# Patient Record
Sex: Female | Born: 1940 | Race: White | Hispanic: No | Marital: Married | Smoking: Never smoker
Health system: Southern US, Community
[De-identification: ages and names within clinical notes are randomized; demographics above are authoritative.]

## PROBLEM LIST (undated history)

## (undated) DIAGNOSIS — I10 Essential (primary) hypertension: Secondary | ICD-10-CM

## (undated) HISTORY — DX: Essential (primary) hypertension: I10

---

## 1973-08-30 HISTORY — PX: LOBECTOMY: SHX5089

## 2012-09-01 ENCOUNTER — Encounter: Payer: Self-pay | Admitting: Family Medicine

## 2012-09-01 ENCOUNTER — Ambulatory Visit (INDEPENDENT_AMBULATORY_CARE_PROVIDER_SITE_OTHER): Payer: Medicare PPO | Admitting: Family Medicine

## 2012-09-01 VITALS — BP 150/85 | HR 66 | Ht 61.5 in | Wt 109.8 lb

## 2012-09-01 DIAGNOSIS — R030 Elevated blood-pressure reading, without diagnosis of hypertension: Secondary | ICD-10-CM

## 2012-09-01 NOTE — Assessment & Plan Note (Signed)
Initial reading 178/88, repeat 150/85.  Patient does have BP cuff at home, is agreeable to monitoring BP, will have her husband bring the cuff to make sure it is accurate and do BP log for a few weeks.  She understands to call to make an appointment if it is consistently high.

## 2012-09-01 NOTE — Progress Notes (Signed)
  Subjective:    Patient ID: Erika Garner, female    DOB: 05-Nov-1940, 72 y.o.   MRN: [id number]  HPI  Mrs. Kandler comes in to establish care.  She is generally healthy and has no complaints today.    She says she has had high blood pressure readings in the past and they had her wear a blood pressure monitor and determined it was stress and work related and she only took a medication for a short while then they stopped it.    Health Maintenance:  Patient says she does not get the flu shot because it gave her a horrible case of the flu Up to date on Pnumococcal, Tdap, Zostavax Up to date on Mammogram and Colonoscopy Has not had a pap in many years- not sure if she had one at 11  Past Medical History  Diagnosis Date  . Hypertension    Family History  Problem Relation Age of Onset  . Hypertension Mother   . Stroke Mother   . COPD Father    Past Surgical History  Procedure Date  . Lobectomy 1975    they thought she had TB, but did not   History  Substance Use Topics  . Smoking status: Never Smoker   . Smokeless tobacco: Never Used  . Alcohol Use: 4.2 oz/week    7 Glasses of wine per week  (has about one glass of wine with dinner/day)  Review of Systems + for decreased vision (seeing optometry) +muscle/joint aches Otherwise a 12 point ROS is negative.     Objective:   Physical Exam BP 150/85  Pulse 66  Ht 5' 1.5" (1.562 m)  Wt 109 lb 12.8 oz (49.805 kg)  BMI 20.41 kg/m2 General appearance: alert, cooperative and no distress Neck: no adenopathy, supple, symmetrical, trachea midline and thyroid not enlarged, symmetric, no tenderness/mass/nodules Lungs: clear to auscultation bilaterally Heart: regular rate and rhythm, S1, S2 normal, no murmur, click, rub or gallop Abdomen: soft, non-tender; bowel sounds normal; no masses,  no organomegaly Extremities: extremities normal, atraumatic, no cyanosis or edema Pulses: 2+ and symmetric       Assessment & Plan:

## 2012-09-01 NOTE — Patient Instructions (Signed)
It was nice to meet you.  Your blood pressure today was 150/85- a little high.  Please start checking your blood pressure 1-2 times per day.  If it is running above 140/90 on a regular basis over the next few weeks, please call the office for a visit.   Please plan on a Well Woman Exam (physical) in the next few months.  If you remember, try to get a morning appointment and do not eat before the appointment so we can check your blood work.

## 2015-05-09 DIAGNOSIS — Z961 Presence of intraocular lens: Secondary | ICD-10-CM | POA: Diagnosis not present

## 2015-05-14 DIAGNOSIS — R079 Chest pain, unspecified: Secondary | ICD-10-CM | POA: Diagnosis not present

## 2015-05-19 DIAGNOSIS — L821 Other seborrheic keratosis: Secondary | ICD-10-CM | POA: Diagnosis not present

## 2015-05-19 DIAGNOSIS — D2261 Melanocytic nevi of right upper limb, including shoulder: Secondary | ICD-10-CM | POA: Diagnosis not present

## 2015-05-19 DIAGNOSIS — L570 Actinic keratosis: Secondary | ICD-10-CM | POA: Diagnosis not present

## 2015-05-19 DIAGNOSIS — D2271 Melanocytic nevi of right lower limb, including hip: Secondary | ICD-10-CM | POA: Diagnosis not present

## 2015-05-19 DIAGNOSIS — L814 Other melanin hyperpigmentation: Secondary | ICD-10-CM | POA: Diagnosis not present

## 2015-10-06 DIAGNOSIS — M7502 Adhesive capsulitis of left shoulder: Secondary | ICD-10-CM | POA: Diagnosis not present

## 2015-10-06 DIAGNOSIS — M25512 Pain in left shoulder: Secondary | ICD-10-CM | POA: Diagnosis not present

## 2015-10-16 DIAGNOSIS — L821 Other seborrheic keratosis: Secondary | ICD-10-CM | POA: Diagnosis not present

## 2015-10-16 DIAGNOSIS — D485 Neoplasm of uncertain behavior of skin: Secondary | ICD-10-CM | POA: Diagnosis not present

## 2015-10-16 DIAGNOSIS — L820 Inflamed seborrheic keratosis: Secondary | ICD-10-CM | POA: Diagnosis not present

## 2015-10-16 DIAGNOSIS — B079 Viral wart, unspecified: Secondary | ICD-10-CM | POA: Diagnosis not present

## 2015-11-03 DIAGNOSIS — M25512 Pain in left shoulder: Secondary | ICD-10-CM | POA: Diagnosis not present

## 2015-11-03 DIAGNOSIS — M7502 Adhesive capsulitis of left shoulder: Secondary | ICD-10-CM | POA: Diagnosis not present

## 2015-12-19 DIAGNOSIS — K1370 Unspecified lesions of oral mucosa: Secondary | ICD-10-CM | POA: Diagnosis not present

## 2016-03-05 DIAGNOSIS — R040 Epistaxis: Secondary | ICD-10-CM | POA: Diagnosis not present

## 2016-03-09 DIAGNOSIS — R040 Epistaxis: Secondary | ICD-10-CM | POA: Diagnosis not present

## 2016-03-09 DIAGNOSIS — J3489 Other specified disorders of nose and nasal sinuses: Secondary | ICD-10-CM | POA: Diagnosis not present

## 2016-04-02 DIAGNOSIS — Z0000 Encounter for general adult medical examination without abnormal findings: Secondary | ICD-10-CM | POA: Diagnosis not present

## 2016-04-02 DIAGNOSIS — R110 Nausea: Secondary | ICD-10-CM | POA: Diagnosis not present

## 2016-04-02 DIAGNOSIS — L821 Other seborrheic keratosis: Secondary | ICD-10-CM | POA: Diagnosis not present

## 2016-04-02 DIAGNOSIS — K589 Irritable bowel syndrome without diarrhea: Secondary | ICD-10-CM | POA: Diagnosis not present

## 2016-04-02 DIAGNOSIS — M810 Age-related osteoporosis without current pathological fracture: Secondary | ICD-10-CM | POA: Diagnosis not present

## 2016-04-02 DIAGNOSIS — W57XXXA Bitten or stung by nonvenomous insect and other nonvenomous arthropods, initial encounter: Secondary | ICD-10-CM | POA: Diagnosis not present

## 2016-04-06 DIAGNOSIS — J3489 Other specified disorders of nose and nasal sinuses: Secondary | ICD-10-CM | POA: Diagnosis not present

## 2016-04-06 DIAGNOSIS — R430 Anosmia: Secondary | ICD-10-CM | POA: Diagnosis not present

## 2016-04-07 DIAGNOSIS — Z961 Presence of intraocular lens: Secondary | ICD-10-CM | POA: Diagnosis not present

## 2016-04-07 DIAGNOSIS — G453 Amaurosis fugax: Secondary | ICD-10-CM | POA: Diagnosis not present

## 2016-04-20 DIAGNOSIS — Z961 Presence of intraocular lens: Secondary | ICD-10-CM | POA: Diagnosis not present

## 2016-04-20 DIAGNOSIS — H35371 Puckering of macula, right eye: Secondary | ICD-10-CM | POA: Diagnosis not present

## 2016-04-20 DIAGNOSIS — H04123 Dry eye syndrome of bilateral lacrimal glands: Secondary | ICD-10-CM | POA: Diagnosis not present

## 2016-04-20 DIAGNOSIS — G453 Amaurosis fugax: Secondary | ICD-10-CM | POA: Diagnosis not present

## 2016-05-12 DIAGNOSIS — H04123 Dry eye syndrome of bilateral lacrimal glands: Secondary | ICD-10-CM | POA: Diagnosis not present

## 2016-05-12 DIAGNOSIS — Z961 Presence of intraocular lens: Secondary | ICD-10-CM | POA: Diagnosis not present

## 2016-10-24 ENCOUNTER — Emergency Department (HOSPITAL_COMMUNITY): Payer: Medicare PPO

## 2016-10-24 ENCOUNTER — Observation Stay (HOSPITAL_COMMUNITY)
Admission: EM | Admit: 2016-10-24 | Discharge: 2016-10-26 | Disposition: A | Discharge status: Home / Self Care | Payer: Medicare PPO | Attending: Internal Medicine | Admitting: Internal Medicine

## 2016-10-24 ENCOUNTER — Encounter (HOSPITAL_COMMUNITY): Payer: Self-pay | Admitting: Emergency Medicine

## 2016-10-24 DIAGNOSIS — R296 Repeated falls: Secondary | ICD-10-CM | POA: Insufficient documentation

## 2016-10-24 DIAGNOSIS — R05 Cough: Secondary | ICD-10-CM

## 2016-10-24 DIAGNOSIS — R2681 Unsteadiness on feet: Secondary | ICD-10-CM | POA: Diagnosis not present

## 2016-10-24 DIAGNOSIS — R112 Nausea with vomiting, unspecified: Secondary | ICD-10-CM

## 2016-10-24 DIAGNOSIS — J101 Influenza due to other identified influenza virus with other respiratory manifestations: Secondary | ICD-10-CM | POA: Diagnosis present

## 2016-10-24 DIAGNOSIS — J9600 Acute respiratory failure, unspecified whether with hypoxia or hypercapnia: Secondary | ICD-10-CM | POA: Insufficient documentation

## 2016-10-24 DIAGNOSIS — E871 Hypo-osmolality and hyponatremia: Secondary | ICD-10-CM | POA: Insufficient documentation

## 2016-10-24 DIAGNOSIS — E878 Other disorders of electrolyte and fluid balance, not elsewhere classified: Secondary | ICD-10-CM | POA: Diagnosis not present

## 2016-10-24 DIAGNOSIS — J111 Influenza due to unidentified influenza virus with other respiratory manifestations: Principal | ICD-10-CM | POA: Insufficient documentation

## 2016-10-24 DIAGNOSIS — R059 Cough, unspecified: Secondary | ICD-10-CM

## 2016-10-24 DIAGNOSIS — E860 Dehydration: Secondary | ICD-10-CM | POA: Diagnosis not present

## 2016-10-24 DIAGNOSIS — I10 Essential (primary) hypertension: Secondary | ICD-10-CM | POA: Diagnosis not present

## 2016-10-24 DIAGNOSIS — R531 Weakness: Secondary | ICD-10-CM

## 2016-10-24 DIAGNOSIS — R69 Illness, unspecified: Secondary | ICD-10-CM

## 2016-10-24 LAB — URINALYSIS, ROUTINE W REFLEX MICROSCOPIC
BACTERIA UA: NONE SEEN
BILIRUBIN URINE: NEGATIVE
GLUCOSE, UA: NEGATIVE mg/dL
HGB URINE DIPSTICK: NEGATIVE
Ketones, ur: 20 mg/dL — AB
LEUKOCYTES UA: NEGATIVE
NITRITE: NEGATIVE
PH: 5 (ref 5.0–8.0)
Protein, ur: 30 mg/dL — AB
Specific Gravity, Urine: 1.018 (ref 1.005–1.030)

## 2016-10-24 LAB — COMPREHENSIVE METABOLIC PANEL
ALT: 17 U/L (ref 14–54)
AST: 29 U/L (ref 15–41)
Albumin: 3.5 g/dL (ref 3.5–5.0)
Alkaline Phosphatase: 40 U/L (ref 38–126)
Anion gap: 12 (ref 5–15)
BUN: 8 mg/dL (ref 6–20)
CO2: 25 mmol/L (ref 22–32)
Calcium: 8.6 mg/dL — ABNORMAL LOW (ref 8.9–10.3)
Chloride: 90 mmol/L — ABNORMAL LOW (ref 101–111)
Creatinine, Ser: 0.66 mg/dL (ref 0.44–1.00)
GFR calc Af Amer: >60 mL/min (ref >60)
GFR calc non Af Amer: >60 mL/min (ref >60)
Glucose, Bld: 148 mg/dL — ABNORMAL HIGH (ref 65–99)
Potassium: 3.6 mmol/L (ref 3.5–5.1)
Sodium: 127 mmol/L — ABNORMAL LOW (ref 135–145)
Total Bilirubin: 0.5 mg/dL (ref 0.3–1.2)
Total Protein: 6.8 g/dL (ref 6.5–8.1)

## 2016-10-24 LAB — CBC WITH DIFFERENTIAL/PLATELET
Basophils Absolute: 0 10*3/uL (ref 0.0–0.1)
Basophils Relative: 0 %
Eosinophils Absolute: 0 10*3/uL (ref 0.0–0.7)
Eosinophils Relative: 0 %
HCT: 39.5 % (ref 36.0–46.0)
Hemoglobin: 13.5 g/dL (ref 12.0–15.0)
Lymphocytes Relative: 7 %
Lymphs Abs: 0.5 10*3/uL — ABNORMAL LOW (ref 0.7–4.0)
MCH: 31.1 pg (ref 26.0–34.0)
MCHC: 34.2 g/dL (ref 30.0–36.0)
MCV: 91 fL (ref 78.0–100.0)
Monocytes Absolute: 0.7 10*3/uL (ref 0.1–1.0)
Monocytes Relative: 10 %
Neutro Abs: 5.4 10*3/uL (ref 1.7–7.7)
Neutrophils Relative %: 83 %
Platelets: 132 10*3/uL — ABNORMAL LOW (ref 150–400)
RBC: 4.34 MIL/uL (ref 3.87–5.11)
RDW: 12.5 % (ref 11.5–15.5)
WBC: 6.5 10*3/uL (ref 4.0–10.5)

## 2016-10-24 LAB — I-STAT CG4 LACTIC ACID, ED: LACTIC ACID, VENOUS: 1.09 mmol/L (ref 0.5–1.9)

## 2016-10-24 LAB — LIPASE, BLOOD: Lipase: 19 U/L (ref 11–51)

## 2016-10-24 MED ORDER — ALBUTEROL SULFATE (2.5 MG/3ML) 0.083% IN NEBU
5.0000 mg | INHALATION_SOLUTION | Freq: Once | RESPIRATORY_TRACT | Status: AC
Start: 2016-10-24 — End: 2016-10-24
  Administered 2016-10-24: 5 mg via RESPIRATORY_TRACT
  Filled 2016-10-24: qty 6

## 2016-10-24 MED ORDER — SODIUM CHLORIDE 0.9 % IV BOLUS (SEPSIS)
1000.0000 mL | Freq: Once | INTRAVENOUS | Status: AC
  Administered 2016-10-24: 1000 mL via INTRAVENOUS

## 2016-10-24 NOTE — ED Provider Notes (Signed)
[facility]-EMERGENCY DEPT Provider Note   CSN: [id number] Arrival date & time: 10/24/16  2200     History   Chief Complaint Chief Complaint  Patient presents with  . Cough  . Fever  . Emesis    HPI Erika Garner is a 76 y.o. female with a hx of Hypertension (diet controlled) presents to the Emergency Department complaining of gradual, persistent, progressively worsening URI and influenza-like illness onset 3 weeks ago. Patient reports that her initial symptoms began with cough, nasal congestion, postnasal drip, sore throat. She reports that one week ago she developed nausea vomiting and diarrhea and addition to her other symptoms. She reports fevers at home in the last several weeks to 100.6. She has taken Tylenol for this with initial improvement. She reports that she's had 6 episodes of nonbloody nonbilious emesis each day for the last 3 days and about that many episodes of watery diarrhea. Denies melena or hematochezia. Patient reports she was seen this morning at [facility]. She tested positive for influenza during this visit. She reports she was given an unknown intramuscular injection and a prescription. She reports the medications made her feel woozy and she has had 6 separate falls tonight due to this. Patient's husband demanded that she be evaluated which is the reason for her visit.    The history is provided by the patient and medical records. No language interpreter was used.    Past Medical History:  Diagnosis Date  . Hypertension     Patient Active Problem List   Diagnosis Date Noted  . Elevated blood pressure reading without diagnosis of hypertension 09/01/2012    Past Surgical History:  Procedure Laterality Date  . LOBECTOMY  1975   they thought she had TB, but did not    OB History    No data available       Home Medications    Prior to Admission medications   Medication Sig Start Date End Date Taking? Authorizing Provider    cholecalciferol (VITAMIN D) 400 UNITS TABS Take by mouth.    Historical Provider, MD    Family History Family History  Problem Relation Age of Onset  . Hypertension Mother   . Stroke Mother   . COPD Father     Social History Social History  Substance Use Topics  . Smoking status: Never Smoker  . Smokeless tobacco: Never Used  . Alcohol use 4.2 oz/week    7 Glasses of wine per week     Allergies   Patient has no known allergies.   Review of Systems Review of Systems  Constitutional: Positive for fever.  HENT: Positive for congestion, postnasal drip, rhinorrhea, sinus pressure and sore throat.   Respiratory: Positive for cough.   Gastrointestinal: Positive for vomiting.  All other systems reviewed and are negative.    Physical Exam Updated Vital Signs BP 150/90   Pulse 96   Temp 99.3 F (37.4 C) (Oral)   Resp 16   Ht 5\' 2"  (1.575 m)   Wt 49.9 kg   SpO2 95%   BMI 20.12 kg/m   Physical Exam  Constitutional: She appears well-developed and well-nourished. No distress.  Awake, alert, nontoxic appearance  HENT:  Head: Normocephalic and atraumatic.  Right Ear: Tympanic membrane, external ear and ear canal normal.  Left Ear: Tympanic membrane, external ear and ear canal normal.  Nose: Mucosal edema and rhinorrhea present. No epistaxis. Right sinus exhibits no maxillary sinus tenderness and no frontal sinus tenderness. Left  sinus exhibits no maxillary sinus tenderness and no frontal sinus tenderness.  Mouth/Throat: Uvula is midline, oropharynx is clear and moist and mucous membranes are normal. Mucous membranes are not pale and not cyanotic. No oropharyngeal exudate, posterior oropharyngeal edema, posterior oropharyngeal erythema or tonsillar abscesses.  Eyes: Conjunctivae are normal. Pupils are equal, round, and reactive to light. No scleral icterus.  Neck: Normal range of motion and full passive range of motion without pain. Neck supple.  Persistent clearing of  throat  Cardiovascular: Normal rate, regular rhythm and intact distal pulses.   Pulmonary/Chest: Effort normal. No stridor. No respiratory distress. She has decreased breath sounds. She has wheezes. She has rhonchi.  Rhonchi and wheezes throughout Congested cough  Abdominal: Soft. Bowel sounds are normal. She exhibits no distension and no mass. There is no tenderness. There is no rebound and no guarding.  Musculoskeletal: Normal range of motion. She exhibits no edema.  Lymphadenopathy:    She has no cervical adenopathy.  Neurological: She is alert.  Speech is clear and goal oriented Moves extremities without ataxia  Skin: Skin is warm and dry. No rash noted. She is not diaphoretic.  Psychiatric: She has a normal mood and affect.  Nursing note and vitals reviewed.    ED Treatments / Results  Labs (all labs ordered are listed, but only abnormal results are displayed) Labs Reviewed  CBC WITH DIFFERENTIAL/PLATELET - Abnormal; Notable for the following:       Result Value   Platelets 132 (*)    Lymphs Abs 0.5 (*)    All other components within normal limits  COMPREHENSIVE METABOLIC PANEL - Abnormal; Notable for the following:    Sodium 127 (*)    Chloride 90 (*)    Glucose, Bld 148 (*)    Calcium 8.6 (*)    All other components within normal limits  URINALYSIS, ROUTINE W REFLEX MICROSCOPIC - Abnormal; Notable for the following:    Ketones, ur 20 (*)    Protein, ur 30 (*)    Squamous Epithelial / LPF 0-5 (*)    All other components within normal limits  LIPASE, BLOOD  INFLUENZA PANEL BY PCR (TYPE A & B)  I-STAT CG4 LACTIC ACID, ED    Radiology Dg Chest 2 View  Result Date: 10/24/2016 CLINICAL DATA:  Cough and fever. EXAM: CHEST  2 VIEW COMPARISON:  None. FINDINGS: Lungs are adequately inflated with mild linear scarring/ atelectasis over the left base. Minimal prominence of the perihilar markings which may be due to mild vascular congestion. Cardiomediastinal silhouette is  within normal. There are mild degenerative changes of the spine IMPRESSION: Possible mild vascular congestion. Minimal left base linear scarring/ atelectasis. Electronically Signed   By: Elberta Fortis M.D.   On: 10/24/2016 23:12    Procedures Procedures (including critical care time)  Medications Ordered in ED Medications  ondansetron (ZOFRAN) injection 4 mg (not administered)  ondansetron (ZOFRAN) injection 4 mg (not administered)  promethazine (PHENERGAN) injection 12.5 mg (not administered)  0.9 %  sodium chloride infusion (not administered)  sodium chloride 0.9 % bolus 1,000 mL (0 mLs Intravenous Stopped 10/24/16 2357)  albuterol (PROVENTIL) (2.5 MG/3ML) 0.083% nebulizer solution 5 mg (5 mg Nebulization Given 10/24/16 2315)  acetaminophen (TYLENOL) tablet 1,000 mg (1,000 mg Oral Given 10/25/16 0048)     Initial Impression / Assessment and Plan / ED Course  I have reviewed the triage vital signs and the nursing notes.  Pertinent labs & imaging results that were available during my care of  the patient were reviewed by me and considered in my medical decision making (see chart for details).  Clinical Course as of Oct 25 49  Mon Oct 25, 2016  0032 Sodium: (!) 127 [HM]  4098 Discussed with Dr. Julian Reil who will admit  [HM]  0050 The patient was discussed with and seen by Dr. Jodi Mourning who agrees with the treatment plan.   [HM]    Clinical Course User Index [HM] Dierdre Forth, PA-C    Patient presents with reportedly diagnosed influenza this morning. She's had 3 days of generalized weakness, persistent vomiting and diarrhea.  Patient has been arrived at bedside and reports that she's been unable to get out of bed for the last 3 days because of her weakness. Today she had a near syncopal episode. She's had almost no oral intake for 3 days.  Patient is febrile here in the emergency department to 102. Hyponatremia noted. Nonfocal neuro exam. Highly doubt posterior circulation  stroke. Patient will need admission for fluids.    Final Clinical Impressions(s) / ED Diagnoses   Final diagnoses:  Non-intractable vomiting with nausea, unspecified vomiting type  Influenza-like illness  Dehydration  Generalized weakness    New Prescriptions New Prescriptions   No medications on file     Dierdre Forth, PA-C 10/25/16 0051    Blane Ohara, MD 10/25/16 [phone]

## 2016-10-24 NOTE — ED Triage Notes (Signed)
Pt presents today for flu like symptoms that have been ongoing for 3 weeks; pt reporting chest congestion, hoarse voice, nasal drainage, subjective fevers, n/v; pt [facility] and given rxs, reports no improvement

## 2016-10-24 NOTE — ED Notes (Signed)
Patient transported to X-ray 

## 2016-10-25 DIAGNOSIS — J111 Influenza due to unidentified influenza virus with other respiratory manifestations: Secondary | ICD-10-CM | POA: Diagnosis not present

## 2016-10-25 DIAGNOSIS — J101 Influenza due to other identified influenza virus with other respiratory manifestations: Secondary | ICD-10-CM | POA: Diagnosis not present

## 2016-10-25 DIAGNOSIS — E871 Hypo-osmolality and hyponatremia: Secondary | ICD-10-CM | POA: Diagnosis not present

## 2016-10-25 DIAGNOSIS — E860 Dehydration: Secondary | ICD-10-CM | POA: Diagnosis present

## 2016-10-25 LAB — BASIC METABOLIC PANEL
ANION GAP: 7 (ref 5–15)
BUN: 8 mg/dL (ref 6–20)
CHLORIDE: 96 mmol/L — AB (ref 101–111)
CO2: 27 mmol/L (ref 22–32)
Calcium: 7.9 mg/dL — ABNORMAL LOW (ref 8.9–10.3)
Creatinine, Ser: 0.6 mg/dL (ref 0.44–1.00)
GFR calc non Af Amer: >60 mL/min (ref >60)
GLUCOSE: 122 mg/dL — AB (ref 65–99)
POTASSIUM: 3.4 mmol/L — AB (ref 3.5–5.1)
Sodium: 130 mmol/L — ABNORMAL LOW (ref 135–145)

## 2016-10-25 LAB — MAGNESIUM: Magnesium: 1.5 mg/dL — ABNORMAL LOW (ref 1.7–2.4)

## 2016-10-25 MED ORDER — ALBUTEROL SULFATE (2.5 MG/3ML) 0.083% IN NEBU: 2.5000 mg | INHALATION_SOLUTION | RESPIRATORY_TRACT | Status: DC | PRN

## 2016-10-25 MED ORDER — POTASSIUM CHLORIDE CRYS ER 20 MEQ PO TBCR
40.0000 meq | EXTENDED_RELEASE_TABLET | Freq: Once | ORAL | Status: AC
  Administered 2016-10-25: 40 meq via ORAL
  Filled 2016-10-25: qty 2

## 2016-10-25 MED ORDER — OSELTAMIVIR PHOSPHATE 75 MG PO CAPS: 75.0000 mg | ORAL_CAPSULE | Freq: Two times a day (BID) | ORAL | Status: DC

## 2016-10-25 MED ORDER — PROMETHAZINE HCL 25 MG/ML IJ SOLN: 12.5000 mg | Freq: Four times a day (QID) | INTRAMUSCULAR | Status: DC | PRN

## 2016-10-25 MED ORDER — SODIUM CHLORIDE 0.9 % IV SOLN
INTRAVENOUS | Status: DC
  Administered 2016-10-25 – 2016-10-26 (×2): via INTRAVENOUS

## 2016-10-25 MED ORDER — ONDANSETRON HCL 4 MG/2ML IJ SOLN
4.0000 mg | Freq: Once | INTRAMUSCULAR | Status: AC
  Administered 2016-10-25: 4 mg via INTRAVENOUS
  Filled 2016-10-25: qty 2

## 2016-10-25 MED ORDER — DM-GUAIFENESIN ER 30-600 MG PO TB12
1.0000 | ORAL_TABLET | Freq: Two times a day (BID) | ORAL | Status: DC
  Administered 2016-10-25 – 2016-10-26 (×4): 1 via ORAL
  Filled 2016-10-25 (×4): qty 1

## 2016-10-25 MED ORDER — HYDROCOD POLST-CPM POLST ER 10-8 MG/5ML PO SUER: 5.0000 mL | Freq: Once | ORAL | Status: DC

## 2016-10-25 MED ORDER — MENTHOL 3 MG MT LOZG
1.0000 | LOZENGE | OROMUCOSAL | Status: DC | PRN
  Administered 2016-10-26: 3 mg via ORAL
  Filled 2016-10-25: qty 9

## 2016-10-25 MED ORDER — ENOXAPARIN SODIUM 40 MG/0.4ML ~~LOC~~ SOLN
40.0000 mg | SUBCUTANEOUS | Status: DC
  Administered 2016-10-25 – 2016-10-26 (×2): 40 mg via SUBCUTANEOUS
  Filled 2016-10-25 (×2): qty 0.4

## 2016-10-25 MED ORDER — ACETAMINOPHEN 500 MG PO TABS
1000.0000 mg | ORAL_TABLET | Freq: Once | ORAL | Status: AC
  Administered 2016-10-25: 1000 mg via ORAL
  Filled 2016-10-25: qty 2

## 2016-10-25 MED ORDER — ACETAMINOPHEN 325 MG PO TABS
650.0000 mg | ORAL_TABLET | Freq: Four times a day (QID) | ORAL | Status: DC | PRN
  Administered 2016-10-25 – 2016-10-26 (×2): 650 mg via ORAL
  Filled 2016-10-25 (×2): qty 2

## 2016-10-25 MED ORDER — OSELTAMIVIR PHOSPHATE 30 MG PO CAPS
30.0000 mg | ORAL_CAPSULE | Freq: Two times a day (BID) | ORAL | Status: DC
  Administered 2016-10-25 – 2016-10-26 (×3): 30 mg via ORAL
  Filled 2016-10-25 (×3): qty 1

## 2016-10-25 MED ORDER — OSELTAMIVIR PHOSPHATE 30 MG PO CAPS: 30.0000 mg | ORAL_CAPSULE | Freq: Two times a day (BID) | ORAL | Status: DC

## 2016-10-25 MED ORDER — ONDANSETRON HCL 4 MG/2ML IJ SOLN: 4.0000 mg | Freq: Four times a day (QID) | INTRAMUSCULAR | Status: DC | PRN

## 2016-10-25 MED ORDER — PHENOL 1.4 % MT LIQD
1.0000 | OROMUCOSAL | Status: DC | PRN
  Administered 2016-10-26: 1 via OROMUCOSAL
  Filled 2016-10-25: qty 177

## 2016-10-25 NOTE — H&P (Addendum)
History and Physical    Erika Daviesatricia R Roca TOI:[id number] DOB: 08/03/1941 DOA: 10/24/2016   [facility] Chief Complaint:  Chief Complaint  Patient presents with  . Cough  . Fever  . Emesis    HPI: Erika Garner is a 76 y.o. female with medical history significant of HTN, diet controlled.  Patient presents to the ED with c/o URI, cough, congestion, N/V.  Initially had URI symptoms 3 weeks ago.  One week ago developed N/V/D, fevers over the past couple of days to 100.6.  Tylenol without relief.  [facility]  Got flu swab there which was positive.  Started on tamiflu and an IM injection.  She felt woozy and had multiple falls this afternoon.  Poor PO intake over last couple of days due to N/V.  ED Course: Hyponatremia and hypochloremia.  Review of Systems: As per HPI otherwise 10 point review of systems negative.    Past Medical History:  Diagnosis Date  . Hypertension     Past Surgical History:  Procedure Laterality Date  . LOBECTOMY  1975   they thought she had TB, but did not     reports that she has never smoked. She has never used smokeless tobacco. She reports that she drinks about 4.2 oz of alcohol per week . She reports that she does not use drugs.  No Known Allergies  Family History  Problem Relation Age of Onset  . Hypertension Mother   . Stroke Mother   . COPD Father       Prior to Admission medications   Medication Sig Start Date End Date Taking? Authorizing Provider  cholecalciferol (VITAMIN D) 400 UNITS TABS Take by mouth.    Historical Provider, MD    Physical Exam: Vitals:   10/25/16 0000 10/25/16 0015 10/25/16 0030 10/25/16 0045  BP: 166/66 141/77 155/75 148/72  Pulse: 93 93  90  Resp:      Temp:      TempSrc:      SpO2: 94% 94%    Weight:      Height:          Constitutional: NAD, calm, comfortable Eyes: PERRL, lids and conjunctivae normal ENMT: Mucous membranes are moist.  Posterior pharynx clear of any exudate or lesions.Normal dentition.  Neck: normal, supple, no masses, no thyromegaly Respiratory: clear to auscultation bilaterally, no wheezing, no crackles. Normal respiratory effort. No accessory muscle use.  Cardiovascular: Regular rate and rhythm, no murmurs / rubs / gallops. No extremity edema. 2+ pedal pulses. No carotid bruits.  Abdomen: no tenderness, no masses palpated. No hepatosplenomegaly. Bowel sounds positive.  Musculoskeletal: no clubbing / cyanosis. No joint deformity upper and lower extremities. Good ROM, no contractures. Normal muscle tone.  Skin: no rashes, lesions, ulcers. No induration Neurologic: CN 2-12 grossly intact. Sensation intact, DTR normal. Strength 5/5 in all 4.  Psychiatric: Normal judgment and insight. Alert and oriented x 3. Normal mood.    Labs on Admission: I have personally reviewed following labs and imaging studies  CBC:  Recent Labs Lab 10/24/16 2247  WBC 6.5  NEUTROABS 5.4  HGB 13.5  HCT 39.5  MCV 91.0  PLT 132*   Basic Metabolic Panel:  Recent Labs Lab 10/24/16 2247  NA 127*  K 3.6  CL 90*  CO2 25  GLUCOSE 148*  BUN 8  CREATININE 0.66  CALCIUM 8.6*   GFR: Estimated Creatinine Clearance: 47.9 mL/min (by C-G formula based on  SCr of 0.66 mg/dL). Liver Function Tests:  Recent Labs Lab 10/24/16 2247  AST 29  ALT 17  ALKPHOS 40  BILITOT 0.5  PROT 6.8  ALBUMIN 3.5    Recent Labs Lab 10/24/16 2247  LIPASE 19   No results for input(s): AMMONIA in the last 168 hours. Coagulation Profile: No results for input(s): INR, PROTIME in the last 168 hours. Cardiac Enzymes: No results for input(s): CKTOTAL, CKMB, CKMBINDEX, TROPONINI in the last 168 hours. BNP (last 3 results) No results for input(s): PROBNP in the last 8760 hours. HbA1C: No results for input(s): HGBA1C in the last 72 hours. CBG: No results for input(s): GLUCAP in the last 168 hours. Lipid Profile: No results for input(s):  CHOL, HDL, LDLCALC, TRIG, CHOLHDL, LDLDIRECT in the last 72 hours. Thyroid Function Tests: No results for input(s): TSH, T4TOTAL, FREET4, T3FREE, THYROIDAB in the last 72 hours. Anemia Panel: No results for input(s): VITAMINB12, FOLATE, FERRITIN, TIBC, IRON, RETICCTPCT in the last 72 hours. Urine analysis:    Component Value Date/Time   COLORURINE YELLOW 10/24/2016 2312   APPEARANCEUR CLEAR 10/24/2016 2312   LABSPEC 1.018 10/24/2016 2312   PHURINE 5.0 10/24/2016 2312   GLUCOSEU NEGATIVE 10/24/2016 2312   HGBUR NEGATIVE 10/24/2016 2312   BILIRUBINUR NEGATIVE 10/24/2016 2312   KETONESUR 20 (A) 10/24/2016 2312   PROTEINUR 30 (A) 10/24/2016 2312   NITRITE NEGATIVE 10/24/2016 2312   LEUKOCYTESUR NEGATIVE 10/24/2016 2312   Sepsis Labs: @LABRCNTIP (procalcitonin:4,lacticidven:4) )No results found for this or any previous visit (from the past 240 hour(s)).   Radiological Exams on Admission: Dg Chest 2 View  Result Date: 10/24/2016 CLINICAL DATA:  Cough and fever. EXAM: CHEST  2 VIEW COMPARISON:  None. FINDINGS: Lungs are adequately inflated with mild linear scarring/ atelectasis over the left base. Minimal prominence of the perihilar markings which may be due to mild vascular congestion. Cardiomediastinal silhouette is within normal. There are mild degenerative changes of the spine IMPRESSION: Possible mild vascular congestion. Minimal left base linear scarring/ atelectasis. Electronically Signed   By: Elberta Fortis M.D.   On: 10/24/2016 23:12    EKG: Independently reviewed.  Assessment/Plan Principal Problem:   Influenza A with respiratory manifestations Active Problems:   Influenza   Dehydration with hyponatremia    1. Influenza - Patient with ILI symptoms and positive flu test at [facility] physicians office today. 1. While I do hear of many false negatives with the office based testing, I haven't really seen any false positives with this test.  Therefore patient almost certainly has  influenza.  Will skip testing. 2. Continue tamiflu 3. Control N/V with zofran, phenergan for breakthrough 4. Tylenol for fever 5. mucinex for chest congestion 2. Dehydration and hyponatremia - due to N/V and poor PO intake 1. IVF, NS at 75 cc/hr 2. Repeat BMP in AM   DVT prophylaxis: Lovenox Code Status: Full Family Communication: No family in room Consults called: None Admission status: Place in 59, Heywood Iles. DO [facility] Pager [phone] from 7PM-7AM  If 7AM-7PM, please contact the day physician for the patient [email or web address] Password [email or web address]  10/25/2016, 1:09 AM

## 2016-10-25 NOTE — Care Management Obs Status (Signed)
MEDICARE OBSERVATION STATUS NOTIFICATION   Patient Details  Name: Erika Garner MRN: [id number] Date of Birth: 01/25/1941   Medicare Observation Status Notification Given:  Yes    Epifanio LeschesCole, Kaylla Cobos Hudson, RN 10/25/2016, 2:14 PM

## 2016-10-25 NOTE — Progress Notes (Signed)
Called for report on patient.  Report given by [employer]. Awaiting patient.

## 2016-10-25 NOTE — Evaluation (Signed)
Physical Therapy Evaluation Patient Details Name: Erika Garner MRN: [id number] DOB: 03-15-1941 Today's Date: 10/25/2016   History of Present Illness   Erika Garner is a 76 y.o. female with medical history significant of HTN, diet controlled.  Patient presents to the ED with c/o URI, cough, congestion, N/V.  Initially had URI symptoms 3 weeks ago.  One week ago developed N/V/D, fevers over the past couple of days to 100.6.  Tylenol without relief.  Went to [place] walk in clinic this morning.  Got flu swab there which was positive.  Started on tamiflu and an IM injection.  She felt woozy and had multiple falls this afternoon.   Clinical Impression  Pt admitted with above diagnosis. Pt currently with functional limitations due to the deficits listed below (see PT Problem List). Pt mildly unsteady with ambulation, expect from 3 days of poor hydration and intake. Will follow acutely but do not anticipate her needing f/u after d/c. Pt will benefit from skilled PT to increase their independence and safety with mobility to allow discharge to the venue listed below.       Follow Up Recommendations No PT follow up    Equipment Recommendations  None recommended by PT    Recommendations for Other Services       Precautions / Restrictions Precautions Precautions: Fall Restrictions Weight Bearing Restrictions: No      Mobility  Bed Mobility Overal bed mobility: Modified Independent                Transfers Overall transfer level: Needs assistance Equipment used: None Transfers: Sit to/from Stand Sit to Stand: Supervision         General transfer comment: no LOB with sit to stand though pt does reach for stable surface quickly after standing up  Ambulation/Gait Ambulation/Gait assistance: Min assist Ambulation Distance (Feet): 250 Feet Assistive device: None Gait Pattern/deviations: Step-through pattern Gait velocity: slightly decreased, guarded Gait velocity  interpretation: Below normal speed for age/gender General Gait Details: mildly unsteady but no overt LOB, often reaches for rail in hallway  Stairs            Wheelchair Mobility    Modified Rankin (Stroke Patients Only)       Balance Overall balance assessment: Needs assistance Sitting-balance support: No upper extremity supported Sitting balance-Leahy Scale: Normal     Standing balance support: No upper extremity supported Standing balance-Leahy Scale: Fair                               Pertinent Vitals/Pain Pain Assessment: No/denies pain    Home Living Family/patient expects to be discharged to:: Private residence Living Arrangements: Spouse/significant other Available Help at Discharge: Family;Available 24 hours/day Type of Home: House Home Access: Stairs to enter   [employer] of Steps: 2 Home Layout: Two level;Able to live on main level with bedroom/bathroom Home Equipment: None Additional Comments: pt's office is upstairs, she does not have to go up there, but normally does daily    Prior Function Level of Independence: Independent         Comments: drives, shops, goes to the gym. Retired 4 yrs ago     Hand Dominance        Extremity/Trunk Assessment   Upper Extremity Assessment Upper Extremity Assessment: Defer to OT evaluation    Lower Extremity Assessment Lower Extremity Assessment: Overall WFL for tasks assessed    Cervical / Trunk Assessment Cervical /  Trunk Assessment: Normal  Communication   Communication: No difficulties  Cognition Arousal/Alertness: Awake/alert Behavior During Therapy: WFL for tasks assessed/performed Overall Cognitive Status: Within Functional Limits for tasks assessed                      General Comments General comments (skin integrity, edema, etc.): pt coughing but unable to get anything up    Exercises     Assessment/Plan    PT Assessment Patient needs continued PT  services  PT Problem List Decreased balance;Decreased mobility;Cardiopulmonary status limiting activity       PT Treatment Interventions Gait training;Stair training;Functional mobility training;Therapeutic activities;Therapeutic exercise;Balance training;Patient/family education    PT Goals (Current goals can be found in the Care Plan section)  Acute Rehab PT Goals Patient Stated Goal: return home PT Goal Formulation: With patient Time For Goal Achievement: 11/08/16 Potential to Achieve Goals: Good    Frequency Min 3X/week   Barriers to discharge        Co-evaluation               End of Session Equipment Utilized During Treatment: Gait belt Activity Tolerance: Patient tolerated treatment well Patient left: in chair;with call bell/phone within reach;with chair alarm set Nurse Communication: Mobility status PT Visit Diagnosis: Unsteadiness on feet (R26.81)    Functional Assessment Tool Used: AM-PAC 6 Clicks Basic Mobility Functional Limitation: Mobility: Walking and moving around Mobility: Walking and Moving Around Current Status ([id number]): At least 20 percent but less than 40 percent impaired, limited or restricted Mobility: Walking and Moving Around Goal Status [phone](G8979): At least 1 percent but less than 20 percent impaired, limited or restricted    Time: 1239-1307 PT Time Calculation (min) (ACUTE ONLY): 28 min   Charges:   PT Evaluation $PT Eval Moderate Complexity: 1 Procedure PT Treatments $Gait Training: 8-22 mins   PT G Codes:   PT G-Codes **NOT FOR INPATIENT CLASS** Functional Assessment Tool Used: AM-PAC 6 Clicks Basic Mobility Functional Limitation: Mobility: Walking and moving around Mobility: Walking and Moving Around Current Status ([id number](G8978): At least 20 percent but less than 40 percent impaired, limited or restricted Mobility: Walking and Moving Around Goal Status [phone]([id number]): At least 1 percent but less than 20 percent impaired, limited or restricted    LuxembourgVictoria Talena Neira, PT  Acute Rehab Services  [phone]   Lawana ChambersVictoria L Kalecia Hartney 10/25/2016, 1:45 PM

## 2016-10-25 NOTE — Progress Notes (Signed)
Patient admitted after midnight- please see H&P.  Continue tamiflu for positive outpatient swab.  Patient with cough- add albuterol and mucinex.  PT eval.  Patient is c/o sore throat as well- add meds  Marlin CanaryJessica Yaritzy Huser DO

## 2016-10-25 NOTE — Progress Notes (Signed)
PHARMACY NOTE:  ANTIMICROBIAL RENAL DOSAGE ADJUSTMENT  Current antimicrobial regimen includes a mismatch between antimicrobial dosage and estimated renal function.  As per policy approved by the Pharmacy & Therapeutics and Medical Executive Committees, the antimicrobial dosage will be adjusted accordingly.  Current antimicrobial dosage:  Tamiflu 75mg  po BID  Indication: Flu  Renal Function:  Estimated Creatinine Clearance: 47.9 mL/min (by C-G formula based on SCr of 0.66 mg/dL).    Antimicrobial dosage has been changed to:  Tamiflu 30mg  BID   Thank you for allowing pharmacy to be a part of this patient's care.  Christoper Fabianaron Sherril Heyward, PharmD, BCPS Clinical pharmacist, pager [phone] 10/25/2016 12:53 AM

## 2016-10-26 ENCOUNTER — Observation Stay (HOSPITAL_COMMUNITY): Payer: Medicare PPO

## 2016-10-26 DIAGNOSIS — E871 Hypo-osmolality and hyponatremia: Secondary | ICD-10-CM

## 2016-10-26 DIAGNOSIS — R05 Cough: Secondary | ICD-10-CM | POA: Diagnosis not present

## 2016-10-26 DIAGNOSIS — J111 Influenza due to unidentified influenza virus with other respiratory manifestations: Secondary | ICD-10-CM | POA: Diagnosis not present

## 2016-10-26 DIAGNOSIS — J101 Influenza due to other identified influenza virus with other respiratory manifestations: Secondary | ICD-10-CM | POA: Diagnosis not present

## 2016-10-26 LAB — BASIC METABOLIC PANEL
Anion gap: 9 (ref 5–15)
BUN: 9 mg/dL (ref 6–20)
CALCIUM: 8.3 mg/dL — AB (ref 8.9–10.3)
CO2: 24 mmol/L (ref 22–32)
Chloride: 102 mmol/L (ref 101–111)
Creatinine, Ser: 0.59 mg/dL (ref 0.44–1.00)
Glucose, Bld: 114 mg/dL — ABNORMAL HIGH (ref 65–99)
Potassium: 3.6 mmol/L (ref 3.5–5.1)
SODIUM: 135 mmol/L (ref 135–145)

## 2016-10-26 LAB — CBC
HEMATOCRIT: 38 % (ref 36.0–46.0)
Hemoglobin: 12.7 g/dL (ref 12.0–15.0)
MCH: 31.3 pg (ref 26.0–34.0)
MCHC: 33.4 g/dL (ref 30.0–36.0)
MCV: 93.6 fL (ref 78.0–100.0)
PLATELETS: 134 10*3/uL — AB (ref 150–400)
RBC: 4.06 MIL/uL (ref 3.87–5.11)
RDW: 12.9 % (ref 11.5–15.5)
WBC: 4.9 10*3/uL (ref 4.0–10.5)

## 2016-10-26 MED ORDER — HYDROCOD POLST-CPM POLST ER 10-8 MG/5ML PO SUER
5.0000 mL | Freq: Once | ORAL | Status: AC
  Administered 2016-10-26: 5 mL via ORAL
  Filled 2016-10-26: qty 5

## 2016-10-26 MED ORDER — BENZONATATE 100 MG PO CAPS: 100.0000 mg | ORAL_CAPSULE | Freq: Three times a day (TID) | ORAL | 0 refills | Status: AC | PRN

## 2016-10-26 MED ORDER — MAGNESIUM SULFATE 2 GM/50ML IV SOLN
2.0000 g | Freq: Once | INTRAVENOUS | Status: AC
  Administered 2016-10-26: 2 g via INTRAVENOUS
  Filled 2016-10-26: qty 50

## 2016-10-26 MED ORDER — DM-GUAIFENESIN ER 30-600 MG PO TB12: 1.0000 | ORAL_TABLET | Freq: Two times a day (BID) | ORAL | Status: AC

## 2016-10-26 NOTE — Progress Notes (Signed)
SATURATION QUALIFICATIONS: (This note is used to comply with regulatory documentation for home oxygen)  Patient Saturations on Room Air at Rest = 98%  Patient Saturations on Room Air while Ambulating = 98%  Patient Saturations on 0 Liters of oxygen while Ambulating = 98%  Please briefly explain why patient needs home oxygen:  

## 2016-10-26 NOTE — Progress Notes (Signed)
PROGRESS NOTE    Erika Daviesatricia R Schuyler  ZOX:[id number] DOB: 10/28/1940 DOA: 10/24/2016 [facility]   Outpatient Specialists:     Brief Narrative:   Erika Garner is a 76 y.o. female with medical history significant of HTN, diet controlled.  Patient presents to the ED with c/o URI, cough, congestion, N/V.  Initially had URI symptoms 3 weeks ago.  One week ago developed N/V/D, fevers over the past couple of days to 100.6.  Tylenol without relief.  Went to [facility] in [facility] this morning.  Got flu swab there which was positive.  Started on tamiflu and an IM injection.  She felt woozy and had multiple falls this afternoon.  Poor PO intake over last couple of days due to N/V.   Assessment & Plan:   Principal Problem:   Influenza A with respiratory manifestations Active Problems:   Influenza   Dehydration with hyponatremia   Acute respiratory failure -wean O2 as tolerated -O2 dropped to < 87% -PRN nebs  CAP and influenza A - x ray on 2/27 shows RLL scarring-- ? Atelectasis-- incentive spirometry added -tamiflu -levaquin -incentive spirometry -supportive care of cough  Dehydration -encourage PO intake   DVT prophylaxis:  Lovenox   Code Status: Full Code   Family Communication: patient  Disposition Plan:     Consultants:    Subjective: Still coughing and having fevers O2 sat dropped last PM and now requiring O2 at 2L  Objective: Vitals:   10/25/16 1422 10/25/16 1630 10/25/16 2139 10/26/16 0610  BP: 136/78  137/75 (!) 156/75  Pulse: 93  82 88  Resp: 20  18 20   Temp: (!) 101.1 F (38.4 C) 100.2 F (37.9 C) 99.3 F (37.4 C) (!) 100.5 F (38.1 C)  TempSrc: Oral Oral Oral   SpO2: 91%  92% 93%  Weight:      Height:        Intake/Output Summary (Last 24 hours) at 10/26/16 1247 Last data filed at 10/25/16 1700  Gross per 24 hour  Intake            637.5 ml  Output                0 ml  Net            637.5 ml   Filed  Weights   10/24/16 2205  Weight: 49.9 kg (110 lb)    Examination:  General exam: Appears calm and comfortable  Respiratory system: Clear to auscultation. Respiratory effort normal. Cardiovascular system: S1 & S2 heard, RRR. No JVD, murmurs, rubs, gallops or clicks. No pedal edema. Gastrointestinal system: Abdomen is nondistended, soft and nontender. No organomegaly or masses felt. Normal bowel sounds heard. Central nervous system: Alert and oriented. No focal neurological deficits. Extremities: Symmetric 5 x 5 power. Skin: No rashes, lesions or ulcers Psychiatry: Judgement and insight appear normal. Mood & affect appropriate.     Data Reviewed: I have personally reviewed following labs and imaging studies  CBC:  Recent Labs Lab 10/24/16 2247 10/26/16 0639  WBC 6.5 4.9  NEUTROABS 5.4  --   HGB 13.5 12.7  HCT 39.5 38.0  MCV 91.0 93.6  PLT 132* 134*   Basic Metabolic Panel:  Recent Labs Lab 10/24/16 2247 10/25/16 0528 10/25/16 1213 10/26/16 0639  NA 127* 130*  --  135  K 3.6 3.4*  --  3.6  CL 90* 96*  --  102  CO2 25 27  --  24  GLUCOSE 148* 122*  --  114*  BUN 8 8  --  9  CREATININE 0.66 0.60  --  0.59  CALCIUM 8.6* 7.9*  --  8.3*  MG  --   --  1.5*  --    GFR: Estimated Creatinine Clearance: 47.9 mL/min (by C-G formula based on SCr of 0.59 mg/dL). Liver Function Tests:  Recent Labs Lab 10/24/16 2247  AST 29  ALT 17  ALKPHOS 40  BILITOT 0.5  PROT 6.8  ALBUMIN 3.5    Recent Labs Lab 10/24/16 2247  LIPASE 19   No results for input(s): AMMONIA in the last 168 hours. Coagulation Profile: No results for input(s): INR, PROTIME in the last 168 hours. Cardiac Enzymes: No results for input(s): CKTOTAL, CKMB, CKMBINDEX, TROPONINI in the last 168 hours. BNP (last 3 results) No results for input(s): PROBNP in the last 8760 hours. HbA1C: No results for input(s): HGBA1C in the last 72 hours. CBG: No results for input(s): GLUCAP in the last 168  hours. Lipid Profile: No results for input(s): CHOL, HDL, LDLCALC, TRIG, CHOLHDL, LDLDIRECT in the last 72 hours. Thyroid Function Tests: No results for input(s): TSH, T4TOTAL, FREET4, T3FREE, THYROIDAB in the last 72 hours. Anemia Panel: No results for input(s): VITAMINB12, FOLATE, FERRITIN, TIBC, IRON, RETICCTPCT in the last 72 hours. Urine analysis:    Component Value Date/Time   COLORURINE YELLOW 10/24/2016 2312   APPEARANCEUR CLEAR 10/24/2016 2312   LABSPEC 1.018 10/24/2016 2312   PHURINE 5.0 10/24/2016 2312   GLUCOSEU NEGATIVE 10/24/2016 2312   HGBUR NEGATIVE 10/24/2016 2312   BILIRUBINUR NEGATIVE 10/24/2016 2312   KETONESUR 20 (A) 10/24/2016 2312   PROTEINUR 30 (A) 10/24/2016 2312   NITRITE NEGATIVE 10/24/2016 2312   LEUKOCYTESUR NEGATIVE 10/24/2016 2312    )No results found for this or any previous visit (from the past 240 hour(s)).    Anti-infectives    Start     Dose/Rate Route Frequency Ordered Stop   10/25/16 1000  oseltamivir (TAMIFLU) capsule 30 mg     30 mg Oral 2 times daily 10/25/16 0109 10/29/16 2159   10/25/16 0100  oseltamivir (TAMIFLU) capsule 75 mg  Status:  Discontinued     75 mg Oral 2 times daily 10/25/16 0051 10/25/16 0052   10/25/16 0100  oseltamivir (TAMIFLU) capsule 30 mg  Status:  Discontinued     30 mg Oral 2 times daily 10/25/16 0053 10/25/16 0109       Radiology Studies: Dg Chest 2 View  Result Date: 10/24/2016 CLINICAL DATA:  Cough and fever. EXAM: CHEST  2 VIEW COMPARISON:  None. FINDINGS: Lungs are adequately inflated with mild linear scarring/ atelectasis over the left base. Minimal prominence of the perihilar markings which may be due to mild vascular congestion. Cardiomediastinal silhouette is within normal. There are mild degenerative changes of the spine IMPRESSION: Possible mild vascular congestion. Minimal left base linear scarring/ atelectasis. Electronically Signed   By: Elberta Fortis M.D.   On: 10/24/2016 23:12         Scheduled Meds: . dextromethorphan-guaiFENesin  1 tablet Oral BID  . enoxaparin (LOVENOX) injection  40 mg Subcutaneous Q24H  . oseltamivir  30 mg Oral BID   Continuous Infusions:   LOS: 0 days    Time spent: 25 min    JESSICA U VANN, DO [facility] Pager [phone]  If 7PM-7AM, please contact night-coverage [email or web address] Password [facility] 10/26/2016, 12:47 PM

## 2016-10-26 NOTE — Discharge Summary (Signed)
Physician Discharge Summary  Erika Daviesatricia R Kosh [id number] DOB: 10/13/1940 DOA: 10/24/2016  [facility]  Admit date: 10/24/2016 Discharge date: 10/26/2016   Recommendations for Outpatient Follow-Up:      Discharge Diagnosis:   Principal Problem:   Influenza A with respiratory manifestations Active Problems:   Influenza   Dehydration with hyponatremia   Discharge disposition:  Home  Discharge Condition: Improved.  Diet recommendation: regular  Wound care: None.   History of Present Illness:   Erika Garner is a 76 y.o. female with medical history significant of HTN, diet controlled.  Patient presents to the ED with c/o URI, cough, congestion, N/V.  Initially had URI symptoms 3 weeks ago.  One week ago developed N/V/D, fevers over the past couple of days to 100.6.  Tylenol without relief.  Went to [facility] this morning.  Got flu swab there which was positive.  Started on tamiflu and an IM injection.  She felt woozy and had multiple falls this afternoon.  Poor PO intake over last couple of days due to N/V.    Hospital Course by Problem:   Acute respiratory failure -resolved -added incentive spiromatry -xray did not show a pna  influenza A - x ray on 2/27 shows RLL scarring-- ? Atelectasis-- incentive spirometry added -tamiflu -incentive spirometry -supportive care of cough  Dehydration -encourage PO intake     Medical Consultants:    None.   Discharge Exam:   Vitals:   10/26/16 0610 10/26/16 1502  BP: (!) 156/75 126/60  Pulse: 88 74  Resp: 20 18  Temp: (!) 100.5 F (38.1 C) 98.4 F (36.9 C)   Vitals:   10/25/16 1630 10/25/16 2139 10/26/16 0610 10/26/16 1502  BP:  137/75 (!) 156/75 126/60  Pulse:  82 88 74  Resp:  18 20 18   Temp: 100.2 F (37.9 C) 99.3 F (37.4 C) (!) 100.5 F (38.1 C) 98.4 F (36.9 C)  TempSrc: Oral Oral  Oral  SpO2:  92% 93% 98%  Weight:      Height:        Gen:   NAD- feeling much better, ready to go home   The results of significant diagnostics from this hospitalization (including imaging, microbiology, ancillary and laboratory) are listed below for reference.     Procedures and Diagnostic Studies:   Dg Chest 2 View  Result Date: 10/24/2016 CLINICAL DATA:  Cough and fever. EXAM: CHEST  2 VIEW COMPARISON:  None. FINDINGS: Lungs are adequately inflated with mild linear scarring/ atelectasis over the left base. Minimal prominence of the perihilar markings which may be due to mild vascular congestion. Cardiomediastinal silhouette is within normal. There are mild degenerative changes of the spine IMPRESSION: Possible mild vascular congestion. Minimal left base linear scarring/ atelectasis. Electronically Signed   By: Elberta Fortisaniel  Boyle M.D.   On: 10/24/2016 23:12     Labs:   Basic Metabolic Panel:  Recent Labs Lab 10/24/16 2247 10/25/16 0528 10/25/16 1213 10/26/16 0639  NA 127* 130*  --  135  K 3.6 3.4*  --  3.6  CL 90* 96*  --  102  CO2 25 27  --  24  GLUCOSE 148* 122*  --  114*  BUN 8 8  --  9  CREATININE 0.66 0.60  --  0.59  CALCIUM 8.6* 7.9*  --  8.3*  MG  --   --  1.5*  --    GFR Estimated Creatinine Clearance: 47.9 mL/min (  by C-G formula based on SCr of 0.59 mg/dL). Liver Function Tests:  Recent Labs Lab 10/24/16 2247  AST 29  ALT 17  ALKPHOS 40  BILITOT 0.5  PROT 6.8  ALBUMIN 3.5    Recent Labs Lab 10/24/16 2247  LIPASE 19   No results for input(s): AMMONIA in the last 168 hours. Coagulation profile No results for input(s): INR, PROTIME in the last 168 hours.  CBC:  Recent Labs Lab 10/24/16 2247 10/26/16 0639  WBC 6.5 4.9  NEUTROABS 5.4  --   HGB 13.5 12.7  HCT 39.5 38.0  MCV 91.0 93.6  PLT 132* 134*   Cardiac Enzymes: No results for input(s): CKTOTAL, CKMB, CKMBINDEX, TROPONINI in the last 168 hours. BNP: Invalid input(s): POCBNP CBG: No results for input(s): GLUCAP in the last 168 hours. D-Dimer No  results for input(s): DDIMER in the last 72 hours. Hgb A1c No results for input(s): HGBA1C in the last 72 hours. Lipid Profile No results for input(s): CHOL, HDL, LDLCALC, TRIG, CHOLHDL, LDLDIRECT in the last 72 hours. Thyroid function studies No results for input(s): TSH, T4TOTAL, T3FREE, THYROIDAB in the last 72 hours.  Invalid input(s): FREET3 Anemia work up No results for input(s): VITAMINB12, FOLATE, FERRITIN, TIBC, IRON, RETICCTPCT in the last 72 hours. Microbiology No results found for this or any previous visit (from the past 240 hour(s)).   Discharge Instructions:   Discharge Instructions    Diet general    Complete by:  As directed    Increase activity slowly    Complete by:  As directed      Allergies as of 10/26/2016      Reactions   Gluten Meal Diarrhea, Other (See Comments)   Abdominal pain      Medication List    TAKE these medications   acetaminophen 500 MG tablet Commonly known as:  TYLENOL Take 500 mg by mouth every 6 (six) hours as needed for fever (pain).   benzonatate 100 MG capsule Commonly known as:  TESSALON Take 1 capsule (100 mg total) by mouth 3 (three) times daily as needed for cough.   dextromethorphan-guaiFENesin 30-600 MG 12hr tablet Commonly known as:  MUCINEX DM Take 1 tablet by mouth 2 (two) times daily.   oseltamivir 75 MG capsule Commonly known as:  TAMIFLU Take 75 mg by mouth 2 (two) times daily. 5 day course started 10/24/16 am   OVER THE COUNTER MEDICATION Take by mouth See admin instructions. Take several different enzymes daily after largest meal (including but not limited to 2 capsules of liver enzymes and 2 capsules of kidney enzymes) ordered by chiropractor from [place].   promethazine 25 MG tablet Commonly known as:  PHENERGAN Take 25 mg by mouth every 4 (four) hours as needed for nausea or vomiting.      Follow-up Information    [facility] Follow up in 1 week(s).   Specialty:  Family  Medicine Contact information: [address] [place] [address] [phone]            Time coordinating discharge: 35 min  Signed:  Ngoc Detjen Juanetta Gosling   [facility] 10/26/2016, 6:36 PM

## 2016-10-26 NOTE — Progress Notes (Signed)
SATURATION QUALIFICATIONS: (This note is used to comply with regulatory documentation for home oxygen)  Patient Saturations on Room Air at Rest = 90%  Patient Saturations on Room Air while Ambulating = 86%  Patient Saturations on 2 Liters of oxygen while Ambulating = 98%  Please briefly explain why patient needs home oxygen

## 2016-10-26 NOTE — Progress Notes (Signed)
Pt. c/o coughing and Schorr, NP paged and order given.

## 2016-10-26 NOTE — Progress Notes (Signed)
PT Cancellation Note  Patient Details Name: Margret R Sikkema MRN: [id number] DOB: Simmie Davies2/01/1941   Cancelled Treatment:    Reason Eval/Treat Not Completed: Other (comment); patient reports plans to go home today and will not be negotiating her stairs initially.  Already walked with RN.  Declines further PT at this time.    Elray McgregorCynthia Wynn 10/26/2016, 4:01 PM  Sheran Lawlessyndi Wynn, PT [phone] 10/26/2016

## 2016-10-26 NOTE — Progress Notes (Signed)
SATURATION QUALIFICATIONS: (This note is used to comply with regulatory documentation for home oxygen)  Patient Saturations on Room Air at Rest = 99 %  Patient Saturations on Room Air while Ambulating = 98 %   

## 2016-10-26 NOTE — Progress Notes (Signed)
Simmie DaviesPatricia R Kitner to be D/C'd Home per MD order.  Discussed with the patient and all questions fully answered.  VSS, Skin clean, dry and intact without evidence of skin break down, no evidence of skin tears noted. IV catheter discontinued intact. Site without signs and symptoms of complications. Dressing and pressure applied.  An After Visit Summary was printed and given to the patient. Patient received prescription.  D/c education completed with patient/family including follow up instructions, medication list, d/c activities limitations if indicated, with other d/c instructions as indicated by MD - patient able to verbalize understanding, all questions fully answered.   Patient instructed to return to ED, call 911, or call MD for any changes in condition.   Patient escorted via WC, and D/C home via private auto.  Melvenia Needlesreti O Gari Trovato 10/26/2016 8:58 PM

## 2016-10-26 NOTE — Progress Notes (Signed)
Asked nurse to repeat home O2 study-- if O2 on room air remains >88% she may be d/c'd today Marlin CanaryJessica Erland Vivas

## 2016-11-02 DIAGNOSIS — J111 Influenza due to unidentified influenza virus with other respiratory manifestations: Secondary | ICD-10-CM | POA: Diagnosis not present

## 2016-11-24 ENCOUNTER — Ambulatory Visit
Admission: RE | Admit: 2016-11-24 | Discharge: 2016-11-24 | Disposition: A | Discharge status: Home / Self Care | Payer: Medicare PPO | Source: Ambulatory Visit | Attending: Family Medicine | Admitting: Family Medicine

## 2016-11-24 ENCOUNTER — Other Ambulatory Visit: Payer: Self-pay | Admitting: Family Medicine

## 2016-11-24 DIAGNOSIS — R05 Cough: Secondary | ICD-10-CM

## 2016-11-24 DIAGNOSIS — R059 Cough, unspecified: Secondary | ICD-10-CM

## 2016-11-24 DIAGNOSIS — R079 Chest pain, unspecified: Secondary | ICD-10-CM | POA: Diagnosis not present

## 2016-12-30 DIAGNOSIS — R05 Cough: Secondary | ICD-10-CM | POA: Diagnosis not present

## 2016-12-30 DIAGNOSIS — K219 Gastro-esophageal reflux disease without esophagitis: Secondary | ICD-10-CM | POA: Diagnosis not present

## 2016-12-30 DIAGNOSIS — R490 Dysphonia: Secondary | ICD-10-CM | POA: Diagnosis not present

## 2016-12-30 DIAGNOSIS — M950 Acquired deformity of nose: Secondary | ICD-10-CM | POA: Diagnosis not present

## 2017-01-05 DIAGNOSIS — R490 Dysphonia: Secondary | ICD-10-CM | POA: Diagnosis not present

## 2017-01-05 DIAGNOSIS — K219 Gastro-esophageal reflux disease without esophagitis: Secondary | ICD-10-CM | POA: Diagnosis not present

## 2017-01-05 DIAGNOSIS — M950 Acquired deformity of nose: Secondary | ICD-10-CM | POA: Diagnosis not present

## 2017-01-05 DIAGNOSIS — R05 Cough: Secondary | ICD-10-CM | POA: Diagnosis not present

## 2017-02-02 DIAGNOSIS — M810 Age-related osteoporosis without current pathological fracture: Secondary | ICD-10-CM | POA: Diagnosis not present

## 2017-04-18 DIAGNOSIS — Z681 Body mass index (BMI) 19 or less, adult: Secondary | ICD-10-CM | POA: Diagnosis not present

## 2017-04-18 DIAGNOSIS — M810 Age-related osteoporosis without current pathological fracture: Secondary | ICD-10-CM | POA: Diagnosis not present

## 2017-05-04 DIAGNOSIS — Z23 Encounter for immunization: Secondary | ICD-10-CM | POA: Diagnosis not present

## 2017-05-04 DIAGNOSIS — K589 Irritable bowel syndrome without diarrhea: Secondary | ICD-10-CM | POA: Diagnosis not present

## 2017-05-04 DIAGNOSIS — M810 Age-related osteoporosis without current pathological fracture: Secondary | ICD-10-CM | POA: Diagnosis not present

## 2017-05-04 DIAGNOSIS — Z0000 Encounter for general adult medical examination without abnormal findings: Secondary | ICD-10-CM | POA: Diagnosis not present

## 2017-06-29 DIAGNOSIS — Z9842 Cataract extraction status, left eye: Secondary | ICD-10-CM | POA: Diagnosis not present

## 2017-06-29 DIAGNOSIS — Z961 Presence of intraocular lens: Secondary | ICD-10-CM | POA: Diagnosis not present

## 2017-06-29 DIAGNOSIS — Z9841 Cataract extraction status, right eye: Secondary | ICD-10-CM | POA: Diagnosis not present

## 2017-07-04 DIAGNOSIS — Z23 Encounter for immunization: Secondary | ICD-10-CM | POA: Diagnosis not present

## 2017-07-04 DIAGNOSIS — M79604 Pain in right leg: Secondary | ICD-10-CM | POA: Diagnosis not present

## 2017-12-28 IMAGING — CR DG CHEST 1V PORT
1 series · 1 of 1 positions shown · non-contrast
Comparison: 10/24/2016

CLINICAL DATA: Cough, flu symptoms, sore throat, nonsmoker

EXAM:
PORTABLE CHEST 1 VIEW

[AP]
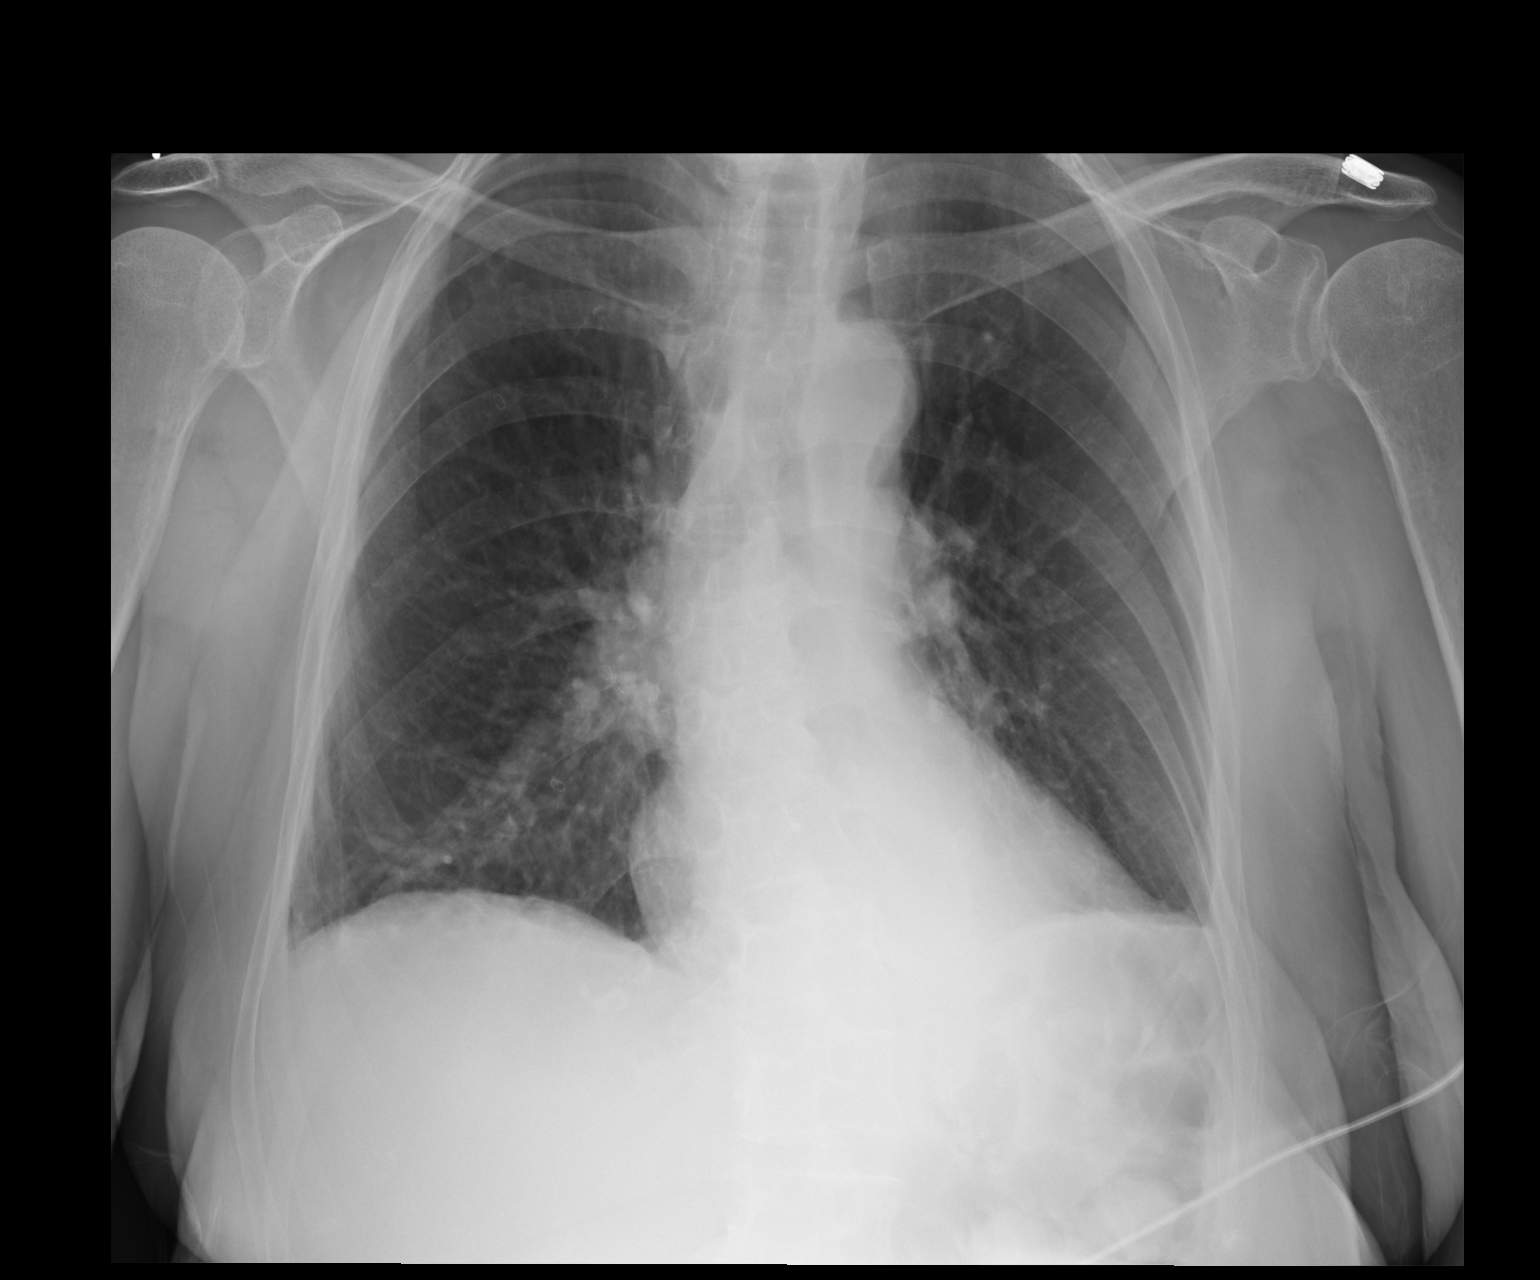

[1 of 1 positions shown; findings below may reference images not displayed]

FINDINGS: Cardiomediastinal silhouette is stable. No infiltrate or pleural
effusion. No pulmonary edema. Mild hyperinflation. Stable mild
bilateral basilar atelectasis or scarring.
IMPRESSION: No active disease. Mild hyperinflation. Stable mild bilateral
basilar atelectasis or scarring.

## 2018-04-28 DIAGNOSIS — L820 Inflamed seborrheic keratosis: Secondary | ICD-10-CM | POA: Diagnosis not present

## 2018-07-10 DIAGNOSIS — J069 Acute upper respiratory infection, unspecified: Secondary | ICD-10-CM | POA: Diagnosis not present

## 2018-07-13 ENCOUNTER — Ambulatory Visit
Admission: RE | Admit: 2018-07-13 | Discharge: 2018-07-13 | Disposition: A | Discharge status: Home / Self Care | Payer: Medicare PPO | Source: Ambulatory Visit | Attending: Family Medicine | Admitting: Family Medicine

## 2018-07-13 ENCOUNTER — Other Ambulatory Visit: Payer: Self-pay | Admitting: Family Medicine

## 2018-07-13 DIAGNOSIS — J069 Acute upper respiratory infection, unspecified: Secondary | ICD-10-CM

## 2018-07-13 DIAGNOSIS — J9811 Atelectasis: Secondary | ICD-10-CM | POA: Diagnosis not present

## 2018-07-24 DIAGNOSIS — J9801 Acute bronchospasm: Secondary | ICD-10-CM | POA: Diagnosis not present

## 2018-08-02 DIAGNOSIS — Z9841 Cataract extraction status, right eye: Secondary | ICD-10-CM | POA: Diagnosis not present

## 2018-08-02 DIAGNOSIS — Z961 Presence of intraocular lens: Secondary | ICD-10-CM | POA: Diagnosis not present

## 2018-08-02 DIAGNOSIS — Z9842 Cataract extraction status, left eye: Secondary | ICD-10-CM | POA: Diagnosis not present

## 2018-08-08 DIAGNOSIS — R490 Dysphonia: Secondary | ICD-10-CM | POA: Diagnosis not present

## 2018-08-08 DIAGNOSIS — I10 Essential (primary) hypertension: Secondary | ICD-10-CM | POA: Diagnosis not present

## 2018-08-08 DIAGNOSIS — K219 Gastro-esophageal reflux disease without esophagitis: Secondary | ICD-10-CM | POA: Diagnosis not present

## 2018-08-08 DIAGNOSIS — R05 Cough: Secondary | ICD-10-CM | POA: Diagnosis not present

## 2018-09-04 DIAGNOSIS — K580 Irritable bowel syndrome with diarrhea: Secondary | ICD-10-CM | POA: Diagnosis not present

## 2018-10-09 DIAGNOSIS — M9901 Segmental and somatic dysfunction of cervical region: Secondary | ICD-10-CM | POA: Diagnosis not present

## 2018-10-09 DIAGNOSIS — S161XXA Strain of muscle, fascia and tendon at neck level, initial encounter: Secondary | ICD-10-CM | POA: Diagnosis not present

## 2018-10-10 DIAGNOSIS — M9901 Segmental and somatic dysfunction of cervical region: Secondary | ICD-10-CM | POA: Diagnosis not present

## 2018-10-10 DIAGNOSIS — S161XXA Strain of muscle, fascia and tendon at neck level, initial encounter: Secondary | ICD-10-CM | POA: Diagnosis not present

## 2018-10-11 DIAGNOSIS — M9901 Segmental and somatic dysfunction of cervical region: Secondary | ICD-10-CM | POA: Diagnosis not present

## 2018-10-11 DIAGNOSIS — S161XXA Strain of muscle, fascia and tendon at neck level, initial encounter: Secondary | ICD-10-CM | POA: Diagnosis not present

## 2018-10-16 DIAGNOSIS — S161XXA Strain of muscle, fascia and tendon at neck level, initial encounter: Secondary | ICD-10-CM | POA: Diagnosis not present

## 2018-10-16 DIAGNOSIS — M9901 Segmental and somatic dysfunction of cervical region: Secondary | ICD-10-CM | POA: Diagnosis not present

## 2018-10-17 DIAGNOSIS — M9901 Segmental and somatic dysfunction of cervical region: Secondary | ICD-10-CM | POA: Diagnosis not present

## 2018-10-17 DIAGNOSIS — S161XXA Strain of muscle, fascia and tendon at neck level, initial encounter: Secondary | ICD-10-CM | POA: Diagnosis not present

## 2018-10-30 ENCOUNTER — Other Ambulatory Visit: Payer: Self-pay | Admitting: Chiropractic Medicine

## 2018-10-30 DIAGNOSIS — S46012A Strain of muscle(s) and tendon(s) of the rotator cuff of left shoulder, initial encounter: Secondary | ICD-10-CM

## 2018-11-02 ENCOUNTER — Ambulatory Visit (HOSPITAL_COMMUNITY): Payer: Medicare PPO

## 2018-11-02 ENCOUNTER — Encounter (HOSPITAL_COMMUNITY): Payer: Self-pay

## 2018-11-07 DIAGNOSIS — M9901 Segmental and somatic dysfunction of cervical region: Secondary | ICD-10-CM | POA: Diagnosis not present

## 2018-11-07 DIAGNOSIS — S161XXA Strain of muscle, fascia and tendon at neck level, initial encounter: Secondary | ICD-10-CM | POA: Diagnosis not present

## 2018-11-13 DIAGNOSIS — M19012 Primary osteoarthritis, left shoulder: Secondary | ICD-10-CM | POA: Diagnosis not present

## 2018-11-13 DIAGNOSIS — M5412 Radiculopathy, cervical region: Secondary | ICD-10-CM | POA: Diagnosis not present

## 2018-11-13 DIAGNOSIS — M542 Cervicalgia: Secondary | ICD-10-CM | POA: Diagnosis not present

## 2018-11-13 DIAGNOSIS — M25512 Pain in left shoulder: Secondary | ICD-10-CM | POA: Diagnosis not present

## 2018-11-14 DIAGNOSIS — M9901 Segmental and somatic dysfunction of cervical region: Secondary | ICD-10-CM | POA: Diagnosis not present

## 2018-11-14 DIAGNOSIS — S161XXA Strain of muscle, fascia and tendon at neck level, initial encounter: Secondary | ICD-10-CM | POA: Diagnosis not present

## 2019-01-02 DIAGNOSIS — J3489 Other specified disorders of nose and nasal sinuses: Secondary | ICD-10-CM | POA: Diagnosis not present

## 2019-01-02 DIAGNOSIS — K219 Gastro-esophageal reflux disease without esophagitis: Secondary | ICD-10-CM | POA: Diagnosis not present

## 2019-01-02 DIAGNOSIS — J343 Hypertrophy of nasal turbinates: Secondary | ICD-10-CM | POA: Diagnosis not present

## 2019-01-02 DIAGNOSIS — I10 Essential (primary) hypertension: Secondary | ICD-10-CM | POA: Diagnosis not present

## 2019-01-30 DIAGNOSIS — M25512 Pain in left shoulder: Secondary | ICD-10-CM | POA: Diagnosis not present

## 2019-01-30 DIAGNOSIS — M542 Cervicalgia: Secondary | ICD-10-CM | POA: Diagnosis not present

## 2019-01-30 DIAGNOSIS — M79602 Pain in left arm: Secondary | ICD-10-CM | POA: Diagnosis not present

## 2019-01-30 DIAGNOSIS — M5412 Radiculopathy, cervical region: Secondary | ICD-10-CM | POA: Diagnosis not present

## 2019-06-11 DIAGNOSIS — Z23 Encounter for immunization: Secondary | ICD-10-CM | POA: Diagnosis not present

## 2019-08-06 DIAGNOSIS — R490 Dysphonia: Secondary | ICD-10-CM | POA: Diagnosis not present

## 2019-08-06 DIAGNOSIS — K219 Gastro-esophageal reflux disease without esophagitis: Secondary | ICD-10-CM | POA: Diagnosis not present

## 2019-08-06 DIAGNOSIS — M950 Acquired deformity of nose: Secondary | ICD-10-CM | POA: Diagnosis not present

## 2019-08-06 DIAGNOSIS — J343 Hypertrophy of nasal turbinates: Secondary | ICD-10-CM | POA: Diagnosis not present

## 2019-08-06 DIAGNOSIS — R05 Cough: Secondary | ICD-10-CM | POA: Diagnosis not present

## 2019-09-03 DIAGNOSIS — K580 Irritable bowel syndrome with diarrhea: Secondary | ICD-10-CM | POA: Diagnosis not present

## 2019-09-04 DIAGNOSIS — D2239 Melanocytic nevi of other parts of face: Secondary | ICD-10-CM | POA: Diagnosis not present

## 2019-09-04 DIAGNOSIS — D485 Neoplasm of uncertain behavior of skin: Secondary | ICD-10-CM | POA: Diagnosis not present

## 2019-09-04 DIAGNOSIS — L821 Other seborrheic keratosis: Secondary | ICD-10-CM | POA: Diagnosis not present

## 2019-09-14 IMAGING — DX DG CHEST 2V
2 series · 2 of 2 positions shown · non-contrast
Comparison: 11/24/2016

CLINICAL DATA: Short of breath for 1 month

EXAM:
CHEST - 2 VIEW

[dg chest 2 view (1 of 2)]
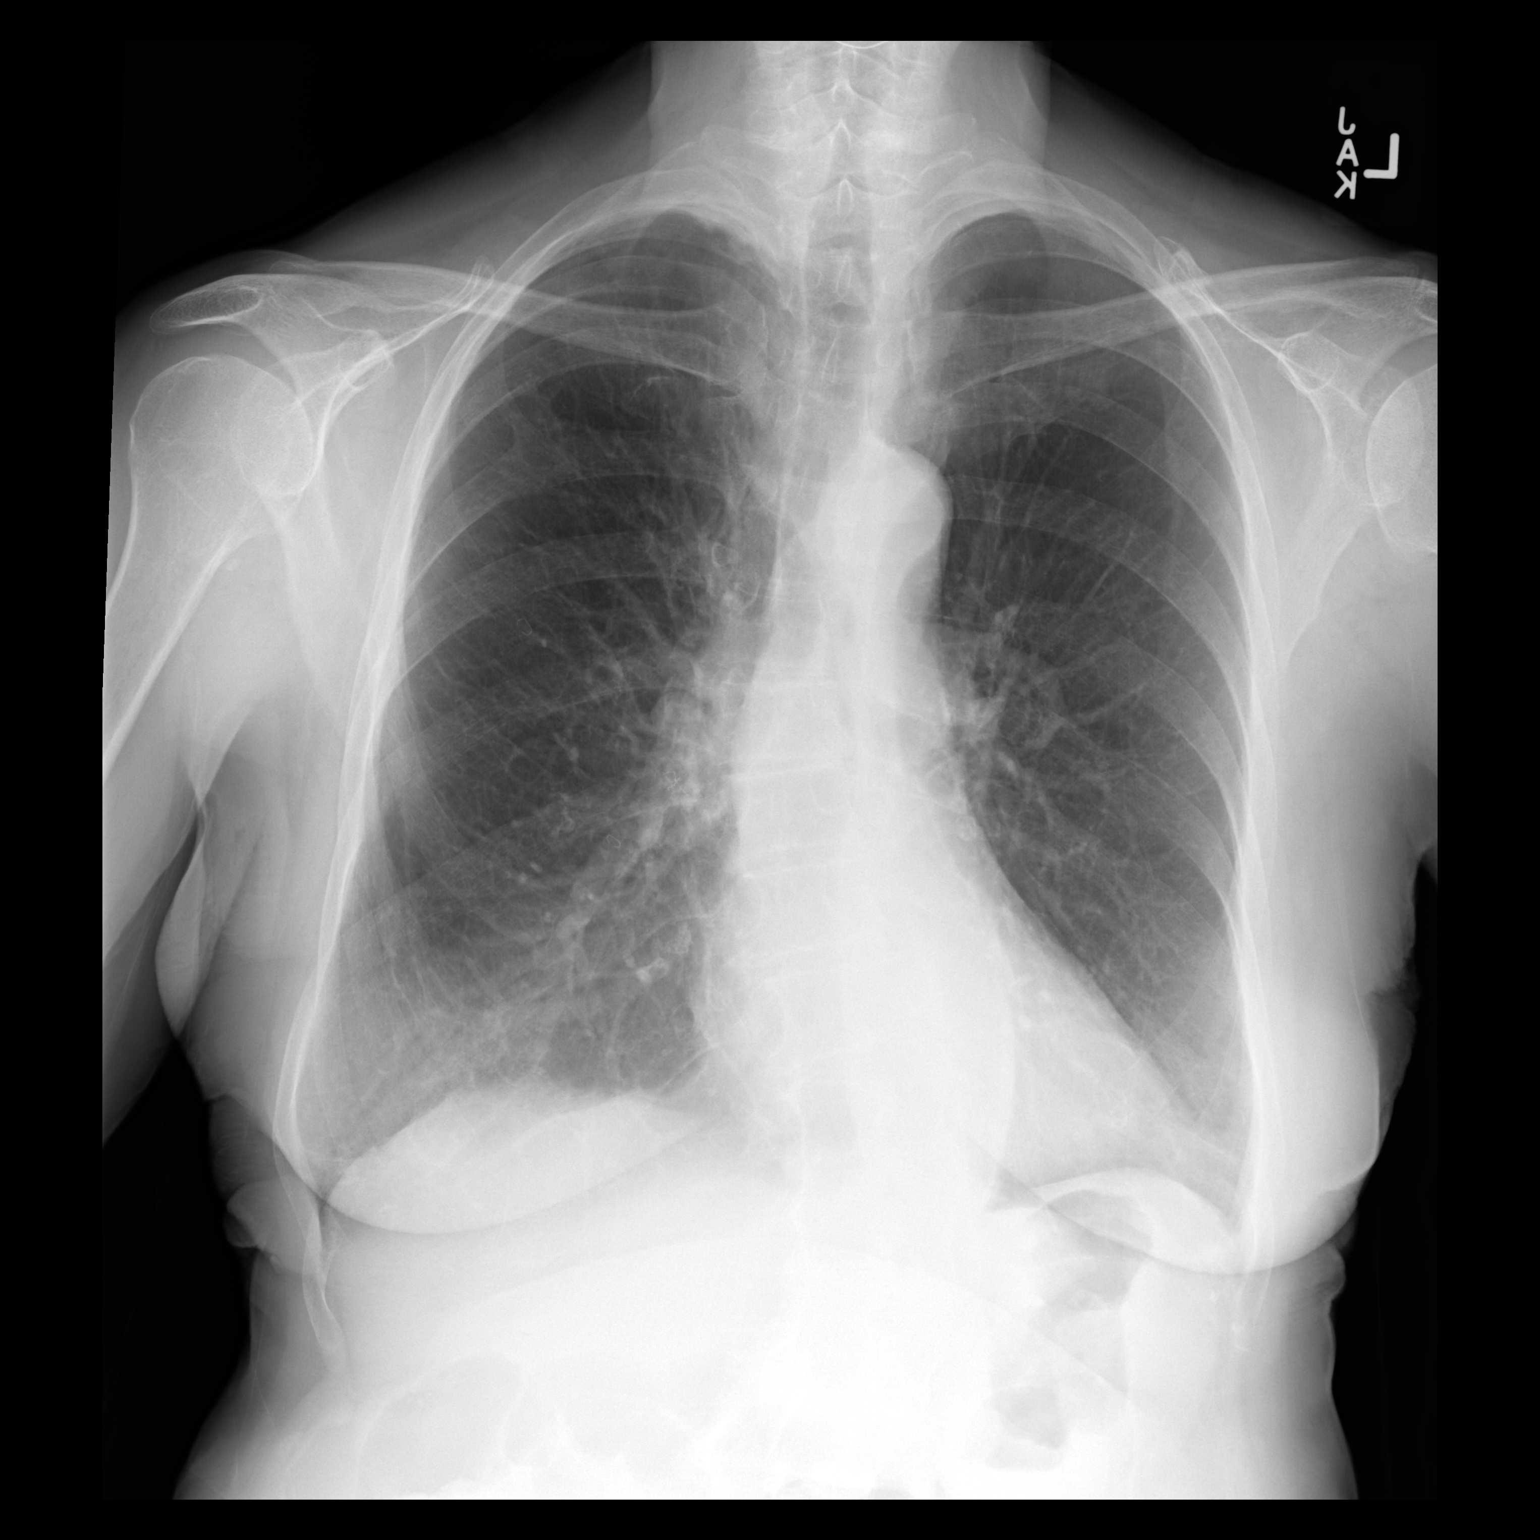

[dg chest 2 view (2 of 2)]
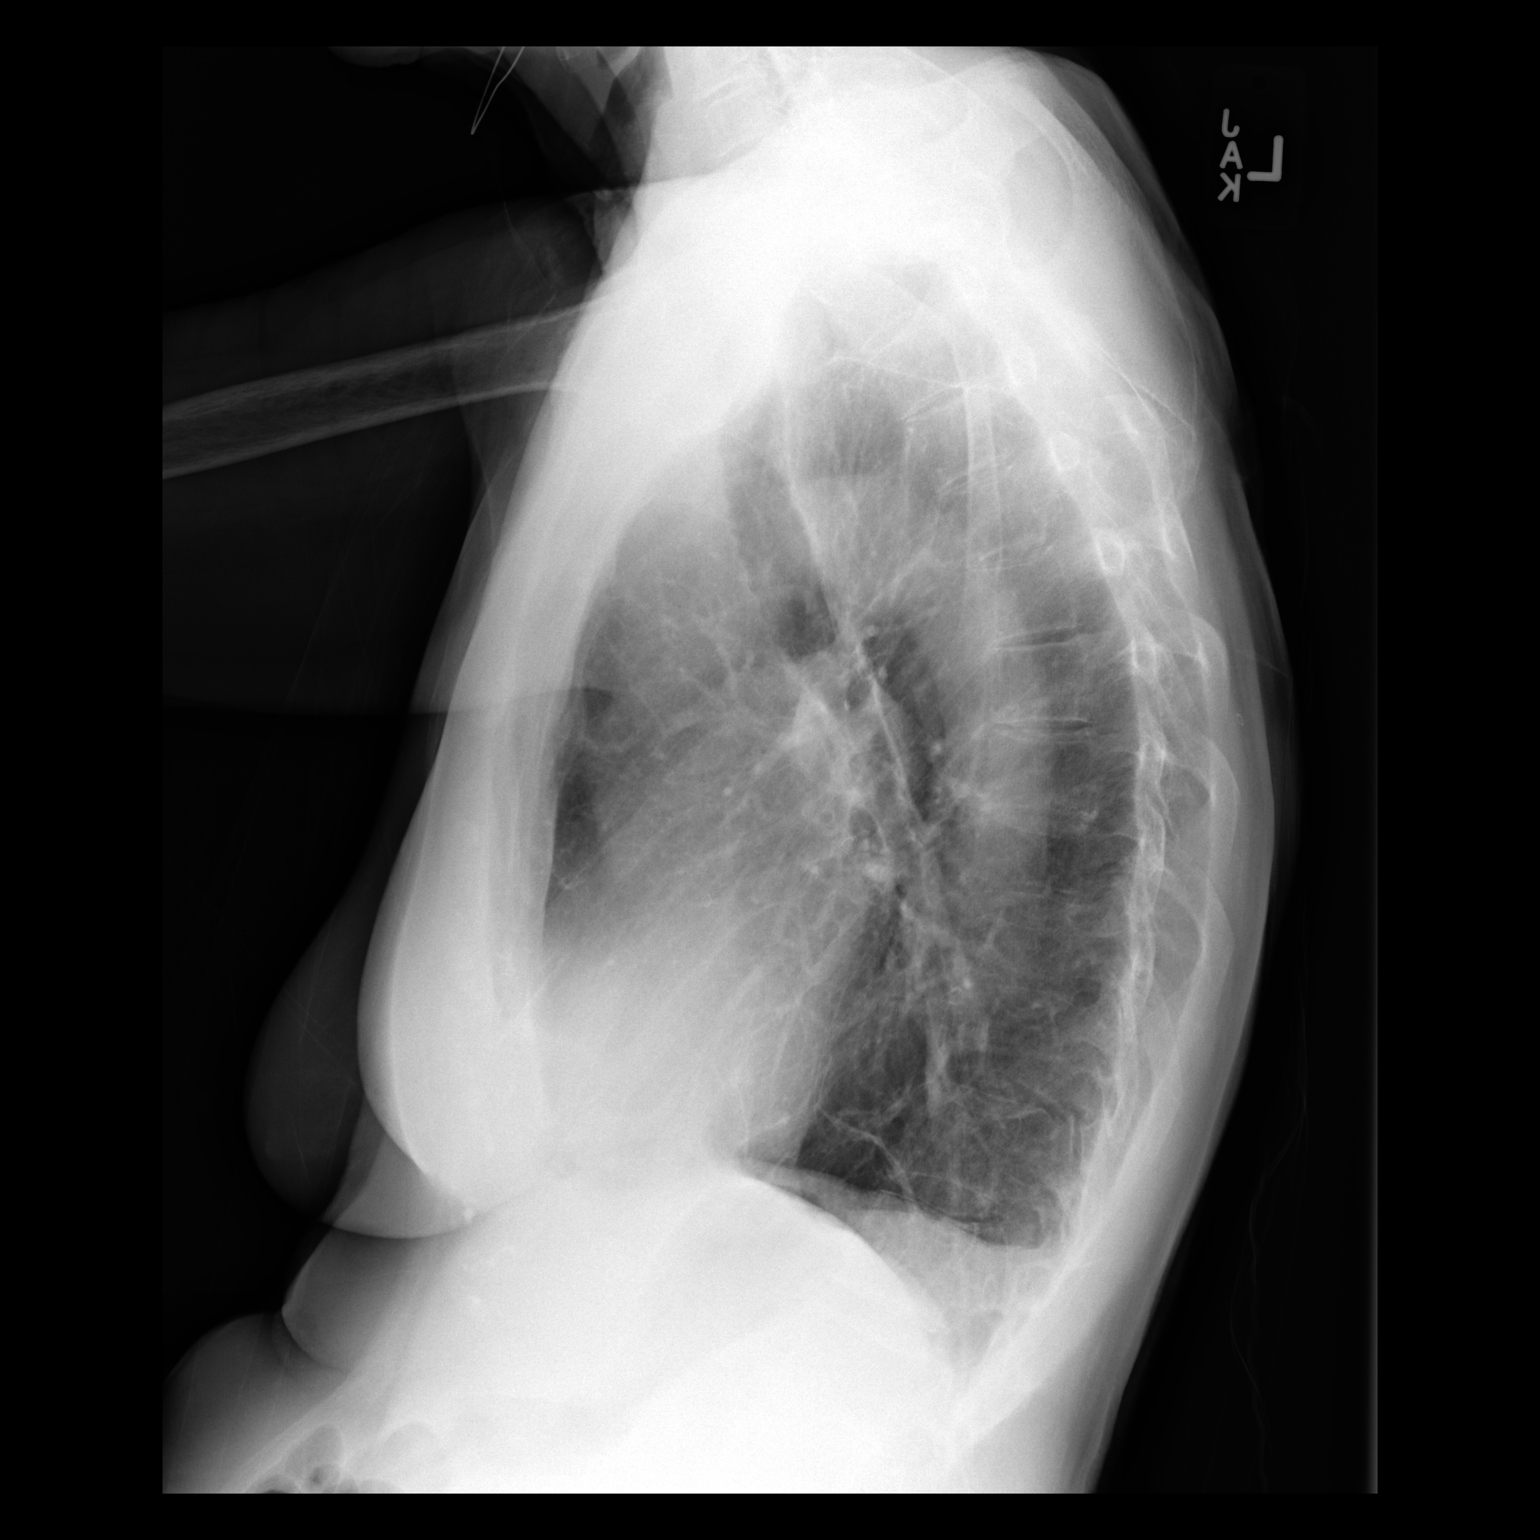

[2 of 2 positions shown; findings below may reference images not displayed]

FINDINGS: Borderline cardiomegaly. Normal vascularity. Hyperaeration.
Bibasilar subsegmental atelectasis.
IMPRESSION: Bibasilar atelectasis.

## 2019-10-01 DEATH — deceased

## 2019-10-25 ENCOUNTER — Ambulatory Visit: Payer: Medicare PPO | Attending: Internal Medicine

## 2019-10-25 DIAGNOSIS — Z23 Encounter for immunization: Secondary | ICD-10-CM | POA: Insufficient documentation

## 2019-10-25 NOTE — Progress Notes (Signed)
   Covid-19 Vaccination Clinic  Name:  [name]    MRN: [id number] DOB: [birth year]  10/25/2019  [name] was observed post Covid-19 immunization for 15 minutes without incidence. She was provided with Vaccine Information Sheet and instruction to access the V-Safe system.   [name] was instructed to call 911 with any severe reactions post vaccine: Marland Kitchen Difficulty breathing  . Swelling of your face and throat  . A fast heartbeat  . A bad rash all over your body  . Dizziness and weakness    Immunizations Administered    Name Date Dose VIS Date Route   Pfizer COVID-19 Vaccine 10/25/2019  8:33 AM 0.3 mL 08/10/2019 Intramuscular   Manufacturer: [employer], [employer]   Lot: [id number]   NDC: [address]-1

## 2019-11-14 ENCOUNTER — Ambulatory Visit: Payer: Medicare PPO | Attending: Internal Medicine

## 2019-11-14 DIAGNOSIS — Z23 Encounter for immunization: Secondary | ICD-10-CM

## 2019-11-14 NOTE — Progress Notes (Signed)
   Covid-19 Vaccination Clinic  Name:  Erika Garner    MRN: [id number] DOB: [birth year]  11/14/2019  [name] was observed post Covid-19 immunization for 15 minutes without incident. She was provided with Vaccine Information Sheet and instruction to access the V-Safe system.   [name] was instructed to call 911 with any severe reactions post vaccine: Marland Kitchen Difficulty breathing  . Swelling of face and throat  . A fast heartbeat  . A bad rash all over body  . Dizziness and weakness   Immunizations Administered    Name Date Dose VIS Date Route   Pfizer COVID-19 Vaccine 11/14/2019 10:19 AM 0.3 mL 08/10/2019 Intramuscular   Manufacturer: [employer], [employer]   Lot: [id number]   NDC: [address]-0

## 2019-12-17 DIAGNOSIS — Z961 Presence of intraocular lens: Secondary | ICD-10-CM | POA: Diagnosis not present

## 2019-12-17 DIAGNOSIS — H52203 Unspecified astigmatism, bilateral: Secondary | ICD-10-CM | POA: Diagnosis not present

## 2019-12-17 DIAGNOSIS — H524 Presbyopia: Secondary | ICD-10-CM | POA: Diagnosis not present

## 2019-12-17 DIAGNOSIS — H43811 Vitreous degeneration, right eye: Secondary | ICD-10-CM | POA: Diagnosis not present

## 2020-01-03 DIAGNOSIS — R030 Elevated blood-pressure reading, without diagnosis of hypertension: Secondary | ICD-10-CM | POA: Diagnosis not present

## 2020-01-03 DIAGNOSIS — M542 Cervicalgia: Secondary | ICD-10-CM | POA: Diagnosis not present

## 2020-01-03 DIAGNOSIS — M818 Other osteoporosis without current pathological fracture: Secondary | ICD-10-CM | POA: Diagnosis not present

## 2020-01-03 DIAGNOSIS — R1013 Epigastric pain: Secondary | ICD-10-CM | POA: Diagnosis not present

## 2020-01-03 DIAGNOSIS — R636 Underweight: Secondary | ICD-10-CM | POA: Diagnosis not present

## 2020-01-07 DIAGNOSIS — K219 Gastro-esophageal reflux disease without esophagitis: Secondary | ICD-10-CM | POA: Diagnosis not present

## 2020-01-07 DIAGNOSIS — J343 Hypertrophy of nasal turbinates: Secondary | ICD-10-CM | POA: Diagnosis not present

## 2020-01-07 DIAGNOSIS — R05 Cough: Secondary | ICD-10-CM | POA: Diagnosis not present

## 2020-01-09 DIAGNOSIS — M542 Cervicalgia: Secondary | ICD-10-CM | POA: Diagnosis not present

## 2020-01-09 DIAGNOSIS — R636 Underweight: Secondary | ICD-10-CM | POA: Diagnosis not present

## 2020-01-09 DIAGNOSIS — R1013 Epigastric pain: Secondary | ICD-10-CM | POA: Diagnosis not present

## 2020-01-11 DIAGNOSIS — H35362 Drusen (degenerative) of macula, left eye: Secondary | ICD-10-CM | POA: Diagnosis not present

## 2020-01-11 DIAGNOSIS — H35371 Puckering of macula, right eye: Secondary | ICD-10-CM | POA: Diagnosis not present

## 2020-01-11 DIAGNOSIS — H43813 Vitreous degeneration, bilateral: Secondary | ICD-10-CM | POA: Diagnosis not present

## 2020-01-11 DIAGNOSIS — H1012 Acute atopic conjunctivitis, left eye: Secondary | ICD-10-CM | POA: Diagnosis not present

## 2020-01-11 DIAGNOSIS — H26493 Other secondary cataract, bilateral: Secondary | ICD-10-CM | POA: Diagnosis not present

## 2020-01-17 DIAGNOSIS — R1013 Epigastric pain: Secondary | ICD-10-CM | POA: Diagnosis not present

## 2020-01-17 DIAGNOSIS — I10 Essential (primary) hypertension: Secondary | ICD-10-CM | POA: Diagnosis not present

## 2020-01-18 DIAGNOSIS — R197 Diarrhea, unspecified: Secondary | ICD-10-CM | POA: Diagnosis not present

## 2020-01-18 DIAGNOSIS — R112 Nausea with vomiting, unspecified: Secondary | ICD-10-CM | POA: Diagnosis not present

## 2020-01-18 DIAGNOSIS — Z8619 Personal history of other infectious and parasitic diseases: Secondary | ICD-10-CM | POA: Diagnosis not present

## 2020-01-22 DIAGNOSIS — R197 Diarrhea, unspecified: Secondary | ICD-10-CM | POA: Diagnosis not present

## 2020-04-11 DIAGNOSIS — H43813 Vitreous degeneration, bilateral: Secondary | ICD-10-CM | POA: Diagnosis not present

## 2020-04-11 DIAGNOSIS — H0100A Unspecified blepharitis right eye, upper and lower eyelids: Secondary | ICD-10-CM | POA: Diagnosis not present

## 2020-04-11 DIAGNOSIS — H0288B Meibomian gland dysfunction left eye, upper and lower eyelids: Secondary | ICD-10-CM | POA: Diagnosis not present

## 2020-04-11 DIAGNOSIS — H43811 Vitreous degeneration, right eye: Secondary | ICD-10-CM | POA: Diagnosis not present

## 2020-04-11 DIAGNOSIS — H00024 Hordeolum internum left upper eyelid: Secondary | ICD-10-CM | POA: Diagnosis not present

## 2020-04-11 DIAGNOSIS — H02884 Meibomian gland dysfunction left upper eyelid: Secondary | ICD-10-CM | POA: Diagnosis not present

## 2020-04-11 DIAGNOSIS — H0288A Meibomian gland dysfunction right eye, upper and lower eyelids: Secondary | ICD-10-CM | POA: Diagnosis not present

## 2020-04-11 DIAGNOSIS — Z961 Presence of intraocular lens: Secondary | ICD-10-CM | POA: Diagnosis not present

## 2020-04-11 DIAGNOSIS — H02881 Meibomian gland dysfunction right upper eyelid: Secondary | ICD-10-CM | POA: Diagnosis not present

## 2020-04-11 DIAGNOSIS — H0102B Squamous blepharitis left eye, upper and lower eyelids: Secondary | ICD-10-CM | POA: Diagnosis not present

## 2020-04-11 DIAGNOSIS — H0102A Squamous blepharitis right eye, upper and lower eyelids: Secondary | ICD-10-CM | POA: Diagnosis not present

## 2020-04-11 DIAGNOSIS — H0100B Unspecified blepharitis left eye, upper and lower eyelids: Secondary | ICD-10-CM | POA: Diagnosis not present

## 2020-04-11 DIAGNOSIS — H52203 Unspecified astigmatism, bilateral: Secondary | ICD-10-CM | POA: Diagnosis not present

## 2020-04-11 DIAGNOSIS — H524 Presbyopia: Secondary | ICD-10-CM | POA: Diagnosis not present

## 2020-04-22 DIAGNOSIS — Z1211 Encounter for screening for malignant neoplasm of colon: Secondary | ICD-10-CM | POA: Diagnosis not present

## 2020-07-16 DIAGNOSIS — H0288B Meibomian gland dysfunction left eye, upper and lower eyelids: Secondary | ICD-10-CM | POA: Diagnosis not present

## 2020-07-16 DIAGNOSIS — H0288A Meibomian gland dysfunction right eye, upper and lower eyelids: Secondary | ICD-10-CM | POA: Diagnosis not present

## 2020-07-16 DIAGNOSIS — Z961 Presence of intraocular lens: Secondary | ICD-10-CM | POA: Diagnosis not present

## 2020-07-23 DIAGNOSIS — M9904 Segmental and somatic dysfunction of sacral region: Secondary | ICD-10-CM | POA: Diagnosis not present

## 2020-07-23 DIAGNOSIS — M9902 Segmental and somatic dysfunction of thoracic region: Secondary | ICD-10-CM | POA: Diagnosis not present

## 2020-07-23 DIAGNOSIS — M9905 Segmental and somatic dysfunction of pelvic region: Secondary | ICD-10-CM | POA: Diagnosis not present

## 2020-07-23 DIAGNOSIS — M9903 Segmental and somatic dysfunction of lumbar region: Secondary | ICD-10-CM | POA: Diagnosis not present

## 2020-07-23 DIAGNOSIS — M5451 Vertebrogenic low back pain: Secondary | ICD-10-CM | POA: Diagnosis not present

## 2020-07-23 DIAGNOSIS — M62830 Muscle spasm of back: Secondary | ICD-10-CM | POA: Diagnosis not present

## 2020-07-29 DIAGNOSIS — M9905 Segmental and somatic dysfunction of pelvic region: Secondary | ICD-10-CM | POA: Diagnosis not present

## 2020-07-29 DIAGNOSIS — M9904 Segmental and somatic dysfunction of sacral region: Secondary | ICD-10-CM | POA: Diagnosis not present

## 2020-07-29 DIAGNOSIS — M62830 Muscle spasm of back: Secondary | ICD-10-CM | POA: Diagnosis not present

## 2020-07-29 DIAGNOSIS — M9902 Segmental and somatic dysfunction of thoracic region: Secondary | ICD-10-CM | POA: Diagnosis not present

## 2020-07-29 DIAGNOSIS — M9903 Segmental and somatic dysfunction of lumbar region: Secondary | ICD-10-CM | POA: Diagnosis not present

## 2020-07-29 DIAGNOSIS — M5451 Vertebrogenic low back pain: Secondary | ICD-10-CM | POA: Diagnosis not present

## 2020-07-31 DIAGNOSIS — M5116 Intervertebral disc disorders with radiculopathy, lumbar region: Secondary | ICD-10-CM | POA: Diagnosis not present

## 2020-07-31 DIAGNOSIS — I10 Essential (primary) hypertension: Secondary | ICD-10-CM | POA: Diagnosis not present

## 2020-07-31 DIAGNOSIS — Z1211 Encounter for screening for malignant neoplasm of colon: Secondary | ICD-10-CM | POA: Diagnosis not present

## 2020-07-31 DIAGNOSIS — Z7289 Other problems related to lifestyle: Secondary | ICD-10-CM | POA: Diagnosis not present

## 2020-07-31 DIAGNOSIS — M5430 Sciatica, unspecified side: Secondary | ICD-10-CM | POA: Diagnosis not present

## 2020-07-31 DIAGNOSIS — R636 Underweight: Secondary | ICD-10-CM | POA: Diagnosis not present

## 2020-08-01 DIAGNOSIS — M5451 Vertebrogenic low back pain: Secondary | ICD-10-CM | POA: Diagnosis not present

## 2020-08-01 DIAGNOSIS — M62830 Muscle spasm of back: Secondary | ICD-10-CM | POA: Diagnosis not present

## 2020-08-01 DIAGNOSIS — M9904 Segmental and somatic dysfunction of sacral region: Secondary | ICD-10-CM | POA: Diagnosis not present

## 2020-08-01 DIAGNOSIS — M9902 Segmental and somatic dysfunction of thoracic region: Secondary | ICD-10-CM | POA: Diagnosis not present

## 2020-08-01 DIAGNOSIS — M9905 Segmental and somatic dysfunction of pelvic region: Secondary | ICD-10-CM | POA: Diagnosis not present

## 2020-08-01 DIAGNOSIS — M9903 Segmental and somatic dysfunction of lumbar region: Secondary | ICD-10-CM | POA: Diagnosis not present

## 2020-08-04 DIAGNOSIS — M5451 Vertebrogenic low back pain: Secondary | ICD-10-CM | POA: Diagnosis not present

## 2020-08-04 DIAGNOSIS — M9905 Segmental and somatic dysfunction of pelvic region: Secondary | ICD-10-CM | POA: Diagnosis not present

## 2020-08-04 DIAGNOSIS — M62830 Muscle spasm of back: Secondary | ICD-10-CM | POA: Diagnosis not present

## 2020-08-04 DIAGNOSIS — M9904 Segmental and somatic dysfunction of sacral region: Secondary | ICD-10-CM | POA: Diagnosis not present

## 2020-08-04 DIAGNOSIS — M9903 Segmental and somatic dysfunction of lumbar region: Secondary | ICD-10-CM | POA: Diagnosis not present

## 2020-08-04 DIAGNOSIS — M9902 Segmental and somatic dysfunction of thoracic region: Secondary | ICD-10-CM | POA: Diagnosis not present

## 2020-08-07 DIAGNOSIS — M9905 Segmental and somatic dysfunction of pelvic region: Secondary | ICD-10-CM | POA: Diagnosis not present

## 2020-08-07 DIAGNOSIS — M9902 Segmental and somatic dysfunction of thoracic region: Secondary | ICD-10-CM | POA: Diagnosis not present

## 2020-08-07 DIAGNOSIS — M62830 Muscle spasm of back: Secondary | ICD-10-CM | POA: Diagnosis not present

## 2020-08-07 DIAGNOSIS — M9903 Segmental and somatic dysfunction of lumbar region: Secondary | ICD-10-CM | POA: Diagnosis not present

## 2020-08-07 DIAGNOSIS — M5451 Vertebrogenic low back pain: Secondary | ICD-10-CM | POA: Diagnosis not present

## 2020-08-07 DIAGNOSIS — M9904 Segmental and somatic dysfunction of sacral region: Secondary | ICD-10-CM | POA: Diagnosis not present

## 2020-08-11 DIAGNOSIS — M9904 Segmental and somatic dysfunction of sacral region: Secondary | ICD-10-CM | POA: Diagnosis not present

## 2020-08-11 DIAGNOSIS — M62830 Muscle spasm of back: Secondary | ICD-10-CM | POA: Diagnosis not present

## 2020-08-11 DIAGNOSIS — M9905 Segmental and somatic dysfunction of pelvic region: Secondary | ICD-10-CM | POA: Diagnosis not present

## 2020-08-11 DIAGNOSIS — M9902 Segmental and somatic dysfunction of thoracic region: Secondary | ICD-10-CM | POA: Diagnosis not present

## 2020-08-11 DIAGNOSIS — M5451 Vertebrogenic low back pain: Secondary | ICD-10-CM | POA: Diagnosis not present

## 2020-08-11 DIAGNOSIS — M9903 Segmental and somatic dysfunction of lumbar region: Secondary | ICD-10-CM | POA: Diagnosis not present

## 2020-08-12 DIAGNOSIS — Z1212 Encounter for screening for malignant neoplasm of rectum: Secondary | ICD-10-CM | POA: Diagnosis not present

## 2020-08-12 DIAGNOSIS — Z1211 Encounter for screening for malignant neoplasm of colon: Secondary | ICD-10-CM | POA: Diagnosis not present

## 2020-08-14 DIAGNOSIS — M5451 Vertebrogenic low back pain: Secondary | ICD-10-CM | POA: Diagnosis not present

## 2020-08-14 DIAGNOSIS — M9903 Segmental and somatic dysfunction of lumbar region: Secondary | ICD-10-CM | POA: Diagnosis not present

## 2020-08-14 DIAGNOSIS — M9902 Segmental and somatic dysfunction of thoracic region: Secondary | ICD-10-CM | POA: Diagnosis not present

## 2020-08-14 DIAGNOSIS — M62830 Muscle spasm of back: Secondary | ICD-10-CM | POA: Diagnosis not present

## 2020-08-14 DIAGNOSIS — M9905 Segmental and somatic dysfunction of pelvic region: Secondary | ICD-10-CM | POA: Diagnosis not present

## 2020-08-14 DIAGNOSIS — M9904 Segmental and somatic dysfunction of sacral region: Secondary | ICD-10-CM | POA: Diagnosis not present

## 2020-08-18 DIAGNOSIS — M9904 Segmental and somatic dysfunction of sacral region: Secondary | ICD-10-CM | POA: Diagnosis not present

## 2020-08-18 DIAGNOSIS — M5451 Vertebrogenic low back pain: Secondary | ICD-10-CM | POA: Diagnosis not present

## 2020-08-18 DIAGNOSIS — M62830 Muscle spasm of back: Secondary | ICD-10-CM | POA: Diagnosis not present

## 2020-08-18 DIAGNOSIS — M9905 Segmental and somatic dysfunction of pelvic region: Secondary | ICD-10-CM | POA: Diagnosis not present

## 2020-08-18 DIAGNOSIS — M9902 Segmental and somatic dysfunction of thoracic region: Secondary | ICD-10-CM | POA: Diagnosis not present

## 2020-08-18 DIAGNOSIS — M9903 Segmental and somatic dysfunction of lumbar region: Secondary | ICD-10-CM | POA: Diagnosis not present

## 2020-08-19 DIAGNOSIS — M818 Other osteoporosis without current pathological fracture: Secondary | ICD-10-CM | POA: Diagnosis not present

## 2020-08-19 DIAGNOSIS — Z780 Asymptomatic menopausal state: Secondary | ICD-10-CM | POA: Diagnosis not present

## 2020-08-21 DIAGNOSIS — M9902 Segmental and somatic dysfunction of thoracic region: Secondary | ICD-10-CM | POA: Diagnosis not present

## 2020-08-21 DIAGNOSIS — M9905 Segmental and somatic dysfunction of pelvic region: Secondary | ICD-10-CM | POA: Diagnosis not present

## 2020-08-21 DIAGNOSIS — M62830 Muscle spasm of back: Secondary | ICD-10-CM | POA: Diagnosis not present

## 2020-08-21 DIAGNOSIS — M9903 Segmental and somatic dysfunction of lumbar region: Secondary | ICD-10-CM | POA: Diagnosis not present

## 2020-08-21 DIAGNOSIS — M9904 Segmental and somatic dysfunction of sacral region: Secondary | ICD-10-CM | POA: Diagnosis not present

## 2020-08-21 DIAGNOSIS — M5451 Vertebrogenic low back pain: Secondary | ICD-10-CM | POA: Diagnosis not present

## 2020-08-25 DIAGNOSIS — M818 Other osteoporosis without current pathological fracture: Secondary | ICD-10-CM | POA: Diagnosis not present

## 2020-08-25 DIAGNOSIS — Z0000 Encounter for general adult medical examination without abnormal findings: Secondary | ICD-10-CM | POA: Diagnosis not present

## 2020-08-25 DIAGNOSIS — M5432 Sciatica, left side: Secondary | ICD-10-CM | POA: Diagnosis not present

## 2020-08-25 DIAGNOSIS — I10 Essential (primary) hypertension: Secondary | ICD-10-CM | POA: Diagnosis not present

## 2020-09-23 DIAGNOSIS — I10 Essential (primary) hypertension: Secondary | ICD-10-CM | POA: Diagnosis not present

## 2021-02-09 DIAGNOSIS — R059 Cough, unspecified: Secondary | ICD-10-CM | POA: Diagnosis not present

## 2021-02-09 DIAGNOSIS — R918 Other nonspecific abnormal finding of lung field: Secondary | ICD-10-CM | POA: Diagnosis not present

## 2021-02-09 DIAGNOSIS — M5432 Sciatica, left side: Secondary | ICD-10-CM | POA: Diagnosis not present

## 2021-02-09 DIAGNOSIS — R1013 Epigastric pain: Secondary | ICD-10-CM | POA: Diagnosis not present

## 2021-02-09 DIAGNOSIS — I10 Essential (primary) hypertension: Secondary | ICD-10-CM | POA: Diagnosis not present

## 2021-02-09 DIAGNOSIS — M818 Other osteoporosis without current pathological fracture: Secondary | ICD-10-CM | POA: Diagnosis not present

## 2021-03-18 DIAGNOSIS — K279 Peptic ulcer, site unspecified, unspecified as acute or chronic, without hemorrhage or perforation: Secondary | ICD-10-CM | POA: Diagnosis not present

## 2021-03-18 DIAGNOSIS — I10 Essential (primary) hypertension: Secondary | ICD-10-CM | POA: Diagnosis not present

## 2021-03-25 DIAGNOSIS — K580 Irritable bowel syndrome with diarrhea: Secondary | ICD-10-CM | POA: Diagnosis not present

## 2021-03-25 DIAGNOSIS — R112 Nausea with vomiting, unspecified: Secondary | ICD-10-CM | POA: Diagnosis not present

## 2021-03-25 DIAGNOSIS — K529 Noninfective gastroenteritis and colitis, unspecified: Secondary | ICD-10-CM | POA: Diagnosis not present

## 2021-05-11 DIAGNOSIS — L821 Other seborrheic keratosis: Secondary | ICD-10-CM | POA: Diagnosis not present

## 2021-05-11 DIAGNOSIS — D485 Neoplasm of uncertain behavior of skin: Secondary | ICD-10-CM | POA: Diagnosis not present

## 2021-05-11 DIAGNOSIS — L570 Actinic keratosis: Secondary | ICD-10-CM | POA: Diagnosis not present

## 2021-07-16 DIAGNOSIS — R059 Cough, unspecified: Secondary | ICD-10-CM | POA: Diagnosis not present

## 2021-07-16 DIAGNOSIS — J069 Acute upper respiratory infection, unspecified: Secondary | ICD-10-CM | POA: Diagnosis not present

## 2021-07-16 DIAGNOSIS — K279 Peptic ulcer, site unspecified, unspecified as acute or chronic, without hemorrhage or perforation: Secondary | ICD-10-CM | POA: Diagnosis not present

## 2021-07-16 DIAGNOSIS — I10 Essential (primary) hypertension: Secondary | ICD-10-CM | POA: Diagnosis not present

## 2021-07-17 DIAGNOSIS — B349 Viral infection, unspecified: Secondary | ICD-10-CM | POA: Diagnosis not present

## 2021-07-21 DIAGNOSIS — Z09 Encounter for follow-up examination after completed treatment for conditions other than malignant neoplasm: Secondary | ICD-10-CM | POA: Diagnosis not present

## 2021-07-21 DIAGNOSIS — R059 Cough, unspecified: Secondary | ICD-10-CM | POA: Diagnosis not present

## 2021-07-28 DIAGNOSIS — R051 Acute cough: Secondary | ICD-10-CM | POA: Diagnosis not present

## 2021-08-07 DIAGNOSIS — H26493 Other secondary cataract, bilateral: Secondary | ICD-10-CM | POA: Diagnosis not present

## 2021-08-07 DIAGNOSIS — H1012 Acute atopic conjunctivitis, left eye: Secondary | ICD-10-CM | POA: Diagnosis not present

## 2021-12-08 DIAGNOSIS — Z23 Encounter for immunization: Secondary | ICD-10-CM | POA: Diagnosis not present

## 2021-12-08 DIAGNOSIS — I10 Essential (primary) hypertension: Secondary | ICD-10-CM | POA: Diagnosis not present

## 2022-03-24 DIAGNOSIS — L820 Inflamed seborrheic keratosis: Secondary | ICD-10-CM | POA: Diagnosis not present

## 2022-03-24 DIAGNOSIS — D485 Neoplasm of uncertain behavior of skin: Secondary | ICD-10-CM | POA: Diagnosis not present

## 2022-03-24 DIAGNOSIS — L821 Other seborrheic keratosis: Secondary | ICD-10-CM | POA: Diagnosis not present

## 2022-04-21 DIAGNOSIS — L03114 Cellulitis of left upper limb: Secondary | ICD-10-CM | POA: Diagnosis not present

## 2022-05-04 DIAGNOSIS — L245 Irritant contact dermatitis due to other chemical products: Secondary | ICD-10-CM | POA: Diagnosis not present

## 2022-06-02 DIAGNOSIS — R051 Acute cough: Secondary | ICD-10-CM | POA: Diagnosis not present

## 2022-06-11 DIAGNOSIS — R059 Cough, unspecified: Secondary | ICD-10-CM | POA: Diagnosis not present

## 2022-07-06 DIAGNOSIS — A159 Respiratory tuberculosis unspecified: Secondary | ICD-10-CM | POA: Diagnosis not present

## 2022-07-06 DIAGNOSIS — K279 Peptic ulcer, site unspecified, unspecified as acute or chronic, without hemorrhage or perforation: Secondary | ICD-10-CM | POA: Diagnosis not present

## 2022-07-06 DIAGNOSIS — R053 Chronic cough: Secondary | ICD-10-CM | POA: Diagnosis not present

## 2022-07-06 DIAGNOSIS — I10 Essential (primary) hypertension: Secondary | ICD-10-CM | POA: Diagnosis not present

## 2022-07-07 DIAGNOSIS — R0989 Other specified symptoms and signs involving the circulatory and respiratory systems: Secondary | ICD-10-CM | POA: Diagnosis not present

## 2022-07-07 DIAGNOSIS — J449 Chronic obstructive pulmonary disease, unspecified: Secondary | ICD-10-CM | POA: Diagnosis not present

## 2022-07-07 DIAGNOSIS — R0609 Other forms of dyspnea: Secondary | ICD-10-CM | POA: Diagnosis not present

## 2022-07-14 DIAGNOSIS — R062 Wheezing: Secondary | ICD-10-CM | POA: Diagnosis not present

## 2022-07-14 DIAGNOSIS — R052 Subacute cough: Secondary | ICD-10-CM | POA: Diagnosis not present

## 2022-07-14 DIAGNOSIS — I10 Essential (primary) hypertension: Secondary | ICD-10-CM | POA: Diagnosis not present

## 2022-08-04 DIAGNOSIS — R053 Chronic cough: Secondary | ICD-10-CM | POA: Diagnosis not present

## 2022-08-06 DIAGNOSIS — R053 Chronic cough: Secondary | ICD-10-CM | POA: Diagnosis not present

## 2022-08-06 DIAGNOSIS — K279 Peptic ulcer, site unspecified, unspecified as acute or chronic, without hemorrhage or perforation: Secondary | ICD-10-CM | POA: Diagnosis not present

## 2022-08-06 DIAGNOSIS — I10 Essential (primary) hypertension: Secondary | ICD-10-CM | POA: Diagnosis not present

## 2022-08-06 DIAGNOSIS — A159 Respiratory tuberculosis unspecified: Secondary | ICD-10-CM | POA: Diagnosis not present

## 2022-08-06 DIAGNOSIS — J449 Chronic obstructive pulmonary disease, unspecified: Secondary | ICD-10-CM | POA: Diagnosis not present

## 2022-08-09 DIAGNOSIS — R053 Chronic cough: Secondary | ICD-10-CM | POA: Diagnosis not present

## 2022-08-09 DIAGNOSIS — A159 Respiratory tuberculosis unspecified: Secondary | ICD-10-CM | POA: Diagnosis not present

## 2022-08-17 DIAGNOSIS — R053 Chronic cough: Secondary | ICD-10-CM | POA: Diagnosis not present

## 2022-08-17 DIAGNOSIS — I10 Essential (primary) hypertension: Secondary | ICD-10-CM | POA: Diagnosis not present

## 2022-08-17 DIAGNOSIS — A159 Respiratory tuberculosis unspecified: Secondary | ICD-10-CM | POA: Diagnosis not present

## 2022-08-17 DIAGNOSIS — J449 Chronic obstructive pulmonary disease, unspecified: Secondary | ICD-10-CM | POA: Diagnosis not present

## 2022-08-17 DIAGNOSIS — K279 Peptic ulcer, site unspecified, unspecified as acute or chronic, without hemorrhage or perforation: Secondary | ICD-10-CM | POA: Diagnosis not present

## 2022-08-25 DIAGNOSIS — Z0000 Encounter for general adult medical examination without abnormal findings: Secondary | ICD-10-CM | POA: Diagnosis not present

## 2022-08-25 DIAGNOSIS — J449 Chronic obstructive pulmonary disease, unspecified: Secondary | ICD-10-CM | POA: Diagnosis not present

## 2022-08-25 DIAGNOSIS — I10 Essential (primary) hypertension: Secondary | ICD-10-CM | POA: Diagnosis not present

## 2022-09-08 DIAGNOSIS — L853 Xerosis cutis: Secondary | ICD-10-CM | POA: Diagnosis not present

## 2022-09-08 DIAGNOSIS — L821 Other seborrheic keratosis: Secondary | ICD-10-CM | POA: Diagnosis not present

## 2022-09-08 DIAGNOSIS — L720 Epidermal cyst: Secondary | ICD-10-CM | POA: Diagnosis not present

## 2022-09-08 DIAGNOSIS — L812 Freckles: Secondary | ICD-10-CM | POA: Diagnosis not present

## 2022-09-29 DIAGNOSIS — I10 Essential (primary) hypertension: Secondary | ICD-10-CM | POA: Diagnosis not present

## 2022-09-29 DIAGNOSIS — J449 Chronic obstructive pulmonary disease, unspecified: Secondary | ICD-10-CM | POA: Diagnosis not present

## 2022-09-29 DIAGNOSIS — Z0000 Encounter for general adult medical examination without abnormal findings: Secondary | ICD-10-CM | POA: Diagnosis not present

## 2022-09-29 DIAGNOSIS — Z8711 Personal history of peptic ulcer disease: Secondary | ICD-10-CM | POA: Diagnosis not present

## 2022-09-29 DIAGNOSIS — Z862 Personal history of diseases of the blood and blood-forming organs and certain disorders involving the immune mechanism: Secondary | ICD-10-CM | POA: Diagnosis not present

## 2022-09-29 DIAGNOSIS — M810 Age-related osteoporosis without current pathological fracture: Secondary | ICD-10-CM | POA: Diagnosis not present

## 2022-12-09 DIAGNOSIS — M9901 Segmental and somatic dysfunction of cervical region: Secondary | ICD-10-CM | POA: Diagnosis not present

## 2022-12-09 DIAGNOSIS — M5412 Radiculopathy, cervical region: Secondary | ICD-10-CM | POA: Diagnosis not present

## 2022-12-09 DIAGNOSIS — M546 Pain in thoracic spine: Secondary | ICD-10-CM | POA: Diagnosis not present

## 2022-12-09 DIAGNOSIS — M62830 Muscle spasm of back: Secondary | ICD-10-CM | POA: Diagnosis not present

## 2022-12-09 DIAGNOSIS — M9902 Segmental and somatic dysfunction of thoracic region: Secondary | ICD-10-CM | POA: Diagnosis not present

## 2022-12-13 DIAGNOSIS — M546 Pain in thoracic spine: Secondary | ICD-10-CM | POA: Diagnosis not present

## 2022-12-13 DIAGNOSIS — M9902 Segmental and somatic dysfunction of thoracic region: Secondary | ICD-10-CM | POA: Diagnosis not present

## 2022-12-13 DIAGNOSIS — M62830 Muscle spasm of back: Secondary | ICD-10-CM | POA: Diagnosis not present

## 2022-12-13 DIAGNOSIS — M9901 Segmental and somatic dysfunction of cervical region: Secondary | ICD-10-CM | POA: Diagnosis not present

## 2022-12-13 DIAGNOSIS — M5412 Radiculopathy, cervical region: Secondary | ICD-10-CM | POA: Diagnosis not present

## 2022-12-15 DIAGNOSIS — M9902 Segmental and somatic dysfunction of thoracic region: Secondary | ICD-10-CM | POA: Diagnosis not present

## 2022-12-15 DIAGNOSIS — M5412 Radiculopathy, cervical region: Secondary | ICD-10-CM | POA: Diagnosis not present

## 2022-12-15 DIAGNOSIS — M62830 Muscle spasm of back: Secondary | ICD-10-CM | POA: Diagnosis not present

## 2022-12-15 DIAGNOSIS — M546 Pain in thoracic spine: Secondary | ICD-10-CM | POA: Diagnosis not present

## 2022-12-15 DIAGNOSIS — M9901 Segmental and somatic dysfunction of cervical region: Secondary | ICD-10-CM | POA: Diagnosis not present

## 2022-12-17 DIAGNOSIS — M9901 Segmental and somatic dysfunction of cervical region: Secondary | ICD-10-CM | POA: Diagnosis not present

## 2022-12-17 DIAGNOSIS — M546 Pain in thoracic spine: Secondary | ICD-10-CM | POA: Diagnosis not present

## 2022-12-17 DIAGNOSIS — M62830 Muscle spasm of back: Secondary | ICD-10-CM | POA: Diagnosis not present

## 2022-12-17 DIAGNOSIS — M5412 Radiculopathy, cervical region: Secondary | ICD-10-CM | POA: Diagnosis not present

## 2022-12-17 DIAGNOSIS — M9902 Segmental and somatic dysfunction of thoracic region: Secondary | ICD-10-CM | POA: Diagnosis not present

## 2022-12-20 DIAGNOSIS — M9902 Segmental and somatic dysfunction of thoracic region: Secondary | ICD-10-CM | POA: Diagnosis not present

## 2022-12-20 DIAGNOSIS — M546 Pain in thoracic spine: Secondary | ICD-10-CM | POA: Diagnosis not present

## 2022-12-20 DIAGNOSIS — M62830 Muscle spasm of back: Secondary | ICD-10-CM | POA: Diagnosis not present

## 2022-12-20 DIAGNOSIS — M9901 Segmental and somatic dysfunction of cervical region: Secondary | ICD-10-CM | POA: Diagnosis not present

## 2022-12-20 DIAGNOSIS — M5412 Radiculopathy, cervical region: Secondary | ICD-10-CM | POA: Diagnosis not present

## 2022-12-21 DIAGNOSIS — I10 Essential (primary) hypertension: Secondary | ICD-10-CM | POA: Diagnosis not present

## 2022-12-24 DIAGNOSIS — M62830 Muscle spasm of back: Secondary | ICD-10-CM | POA: Diagnosis not present

## 2022-12-24 DIAGNOSIS — M9902 Segmental and somatic dysfunction of thoracic region: Secondary | ICD-10-CM | POA: Diagnosis not present

## 2022-12-24 DIAGNOSIS — M546 Pain in thoracic spine: Secondary | ICD-10-CM | POA: Diagnosis not present

## 2022-12-24 DIAGNOSIS — M9901 Segmental and somatic dysfunction of cervical region: Secondary | ICD-10-CM | POA: Diagnosis not present

## 2022-12-24 DIAGNOSIS — M5412 Radiculopathy, cervical region: Secondary | ICD-10-CM | POA: Diagnosis not present

## 2023-02-07 DIAGNOSIS — M542 Cervicalgia: Secondary | ICD-10-CM | POA: Diagnosis not present

## 2023-02-07 DIAGNOSIS — R42 Dizziness and giddiness: Secondary | ICD-10-CM | POA: Diagnosis not present

## 2023-02-08 ENCOUNTER — Other Ambulatory Visit: Payer: Self-pay

## 2023-02-08 ENCOUNTER — Ambulatory Visit
Admission: RE | Admit: 2023-02-08 | Discharge: 2023-02-08 | Disposition: A | Discharge status: Home / Self Care | Payer: Medicare PPO | Source: Ambulatory Visit | Attending: Family Medicine | Admitting: Family Medicine

## 2023-02-08 DIAGNOSIS — M542 Cervicalgia: Secondary | ICD-10-CM | POA: Diagnosis not present

## 2023-03-01 DIAGNOSIS — F79 Unspecified intellectual disabilities: Secondary | ICD-10-CM | POA: Diagnosis not present

## 2023-03-01 DIAGNOSIS — M542 Cervicalgia: Secondary | ICD-10-CM | POA: Diagnosis not present

## 2023-03-01 DIAGNOSIS — Z9181 History of falling: Secondary | ICD-10-CM | POA: Diagnosis not present

## 2023-03-01 DIAGNOSIS — M25562 Pain in left knee: Secondary | ICD-10-CM | POA: Diagnosis not present

## 2023-03-02 ENCOUNTER — Other Ambulatory Visit: Payer: Self-pay | Admitting: Family Medicine

## 2023-03-02 DIAGNOSIS — F79 Unspecified intellectual disabilities: Secondary | ICD-10-CM

## 2023-03-04 ENCOUNTER — Other Ambulatory Visit: Payer: Self-pay | Admitting: Family Medicine

## 2023-03-04 DIAGNOSIS — M542 Cervicalgia: Secondary | ICD-10-CM

## 2023-03-09 ENCOUNTER — Ambulatory Visit
Admission: RE | Admit: 2023-03-09 | Discharge: 2023-03-09 | Disposition: A | Discharge status: Home / Self Care | Payer: Medicare PPO | Source: Ambulatory Visit | Attending: Family Medicine | Admitting: Family Medicine

## 2023-03-09 DIAGNOSIS — M50322 Other cervical disc degeneration at C5-C6 level: Secondary | ICD-10-CM | POA: Diagnosis not present

## 2023-03-09 DIAGNOSIS — M50321 Other cervical disc degeneration at C4-C5 level: Secondary | ICD-10-CM | POA: Diagnosis not present

## 2023-03-09 DIAGNOSIS — F79 Unspecified intellectual disabilities: Secondary | ICD-10-CM

## 2023-03-09 DIAGNOSIS — M47812 Spondylosis without myelopathy or radiculopathy, cervical region: Secondary | ICD-10-CM | POA: Diagnosis not present

## 2023-03-09 DIAGNOSIS — R410 Disorientation, unspecified: Secondary | ICD-10-CM | POA: Diagnosis not present

## 2023-03-09 DIAGNOSIS — M542 Cervicalgia: Secondary | ICD-10-CM

## 2023-03-09 DIAGNOSIS — M50323 Other cervical disc degeneration at C6-C7 level: Secondary | ICD-10-CM | POA: Diagnosis not present

## 2023-03-09 DIAGNOSIS — M4802 Spinal stenosis, cervical region: Secondary | ICD-10-CM | POA: Diagnosis not present

## 2023-03-09 DIAGNOSIS — G3184 Mild cognitive impairment, so stated: Secondary | ICD-10-CM | POA: Diagnosis not present

## 2023-03-09 MED ORDER — GADOPICLENOL 0.5 MMOL/ML IV SOLN
5.0000 mL | Freq: Once | INTRAVENOUS | Status: AC | PRN
  Administered 2023-03-09: 5 mL via INTRAVENOUS

## 2023-03-23 ENCOUNTER — Ambulatory Visit: Payer: Medicare PPO | Admitting: Orthopedic Surgery

## 2023-03-25 ENCOUNTER — Other Ambulatory Visit (INDEPENDENT_AMBULATORY_CARE_PROVIDER_SITE_OTHER): Payer: Medicare PPO

## 2023-03-25 ENCOUNTER — Encounter: Payer: Self-pay | Admitting: Orthopedic Surgery

## 2023-03-25 ENCOUNTER — Ambulatory Visit: Payer: Medicare PPO | Admitting: Orthopedic Surgery

## 2023-03-25 ENCOUNTER — Telehealth: Payer: Self-pay

## 2023-03-25 DIAGNOSIS — M25562 Pain in left knee: Secondary | ICD-10-CM

## 2023-03-25 DIAGNOSIS — M79672 Pain in left foot: Secondary | ICD-10-CM

## 2023-03-25 DIAGNOSIS — M542 Cervicalgia: Secondary | ICD-10-CM | POA: Diagnosis not present

## 2023-03-25 NOTE — Progress Notes (Signed)
Office Visit Note   Patient: Erika Garner           Date of Birth: Feb 03, 1941           MRN: [id number] Visit Date: 03/25/2023 Requested by: Clovis Riley, L.August Saucer, MD [address] [place],  [place] [address] PCP: Irven Coe, MD  Subjective: Chief Complaint  Patient presents with   Left Knee - Pain    HPI: Erika Garner is a 82 y.o. female who presents to the office reporting 8-week history of left knee pain.  Patient is very active and she injured it doing a bird pose where she was standing on the left knee and leg and raise up her right leg to balance.  Reports primarily posterior pain which has improved.  Did have swelling initially.  Does describe some mechanical symptoms in the knee as well.  She used ice.  And heat for her symptoms.  Patient also reports left foot pain.  Been going on since March.  She feels like she has a small mass on the plantar aspect of her foot just proximal to the third MTP joint.  She has gone to sports medicine clinic and that has resolved some but she does report continued soreness on the bottom of the foot.  She has done massage treatment for this problem.  Denies any numbness and tingling in her toes.  Patient also has neck pain.  Had some type of manipulation which subsequently gave her pain in her neck.  She has had an MRI scan which shows C5-6 moderate to severe foraminal narrowing.  Denies much in the way of upper extremity symptoms..                ROS: All systems reviewed are negative as they relate to the chief complaint within the history of present illness.  Patient denies fevers or chills.  Assessment & Plan: Visit Diagnoses:  1. Left knee pain, unspecified chronicity   2. Pain in left foot   3. Neck pain     Plan: Impression is left knee pain with mild medial joint space narrowing.  She has had a family member who has had very good results with gel injection.  We will get that preapproved for her.  Does not really sound  like there is anything surgical in the knee at this time.  Regarding the left foot pain this looks like a small corn but there is no overlying bony prominence.  That something that I would try acetylsalicylic acid patch for followed by pumice stone.  If that fails to relieve symptoms then referral to Dr. Lajoyce Corners could be considered for excision in the clinic.  Regarding her neck the MRI scan is reviewed with the patient.  This is something that I think could be treated initially with epidural steroid injections but she would like to get the opinion of a spine surgeon on this problem.  I will refer her to Dr. Christell Constant for that opinion.  She will follow-up with [place] in 3 months.  We could consider gel injection at that time depending on how her symptoms evolve in the left knee. This patient is diagnosed with osteoarthritis of the knee(s).    Radiographs show evidence of joint space narrowing, osteophytes, subchondral sclerosis and/or subchondral cysts.  This patient has knee pain which interferes with functional and activities of daily living.    This patient has experienced inadequate response, adverse effects and/or intolerance with conservative treatments such as  acetaminophen, NSAIDS, topical creams, physical therapy or regular exercise, knee bracing and/or weight loss.   This patient has experienced inadequate response or has a contraindication to intra articular steroid injections for at least 3 months.   This patient is not scheduled to have a total knee replacement within 6 months of starting treatment with viscosupplementation.   Follow-Up Instructions: No follow-ups on file.   Orders:  Orders Placed This Encounter  Procedures   XR KNEE 3 VIEW LEFT   XR Foot Complete Left   Ambulatory referral to Orthopedic Surgery   No orders of the defined types were placed in this encounter.     Procedures: No procedures performed   Clinical Data: No additional findings.  Objective: Vital  Signs: There were no vitals taken for this visit.  Physical Exam:  Constitutional: Patient appears well-developed HEENT:  Head: Normocephalic Eyes:EOM are normal Neck: Normal range of motion Cardiovascular: Normal rate Pulmonary/chest: Effort normal Neurologic: Patient is alert Skin: Skin is warm Psychiatric: Patient has normal mood and affect  Ortho Exam: Ortho exam demonstrates left knee full range of motion.  No effusion in the knee.  No Baker's cyst.  Patient has good muscle tone in her legs.  No atrophy.  Pedal pulses palpable.  Ankle dorsiflexion plantarflexion quad hamstring strength intact.  No masses lymphadenopathy or skin changes noted in that left knee region.  No groin pain on the left with internal or external rotation of the leg.  Left foot is examined.  Patient has palpable pedal pulses palpable intact nontender anterior to posterior to peroneal and Achilles tendons.  Has what appears to be a corn on the plantar aspect of the foot just proximal to the third metatarsal head.  Slightly tender to palpation but it is small.  No lesser toe deformities.  Heel cord not excessively tight.  Patient has reasonable cervical spine range of motion and intact motor or sensory function in the upper extremities.  No muscle atrophy in the arms.  Specialty Comments:  No specialty comments available.  Imaging: XR KNEE 3 VIEW LEFT  Result Date: 03/25/2023 AP lateral merchant radiographs left knee reviewed.  Mild degenerative changes present without bone-on-bone changes.  There is some medial joint space narrowing without significant spurring.  Alignment intact.  XR Foot Complete Left  Result Date: 03/25/2023 AP lateral oblique radiographs left foot reviewed.  Tarsometatarsal alignment intact.  No acute fracture.  Sesamoids intact.  Mild degenerative midfoot changes present.  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    PMFS History: Patient Active  Problem List   Diagnosis Date Noted   Influenza A with respiratory manifestations 10/25/2016   Influenza 10/25/2016   Dehydration with hyponatremia 10/25/2016   Elevated blood pressure reading without diagnosis of hypertension 09/01/2012   Past Medical History:  Diagnosis Date   Hypertension     Family History  Problem Relation Age of Onset   Hypertension Mother    Stroke Mother    COPD Father     Past Surgical History:  Procedure Laterality Date   LOBECTOMY  1975   they thought she had TB, but did not   Social History   Occupational History   Not on file  Tobacco Use   Smoking status: Never   Smokeless tobacco: Never  Substance and Sexual Activity   Alcohol use: Yes    Alcohol/week: 7.0 standard drinks of alcohol    Types: 7 Glasses of wine per week   Drug use: No  Sexual activity: Not on file

## 2023-03-25 NOTE — Telephone Encounter (Signed)
Auth needed for left knee gel 

## 2023-03-30 NOTE — Telephone Encounter (Signed)
VOB submitted for Durolane, left knee.

## 2023-04-04 DIAGNOSIS — M5481 Occipital neuralgia: Secondary | ICD-10-CM | POA: Diagnosis not present

## 2023-04-04 DIAGNOSIS — M542 Cervicalgia: Secondary | ICD-10-CM | POA: Diagnosis not present

## 2023-04-08 ENCOUNTER — Other Ambulatory Visit: Payer: Self-pay | Admitting: Neurosurgery

## 2023-04-08 ENCOUNTER — Other Ambulatory Visit: Payer: Medicare PPO

## 2023-04-08 DIAGNOSIS — M542 Cervicalgia: Secondary | ICD-10-CM

## 2023-04-18 ENCOUNTER — Other Ambulatory Visit (INDEPENDENT_AMBULATORY_CARE_PROVIDER_SITE_OTHER): Payer: Medicare PPO

## 2023-04-18 ENCOUNTER — Encounter: Payer: Self-pay | Admitting: Orthopedic Surgery

## 2023-04-18 ENCOUNTER — Ambulatory Visit (INDEPENDENT_AMBULATORY_CARE_PROVIDER_SITE_OTHER): Payer: Medicare PPO | Admitting: Orthopedic Surgery

## 2023-04-18 VITALS — BP 115/72 | HR 79 | Ht 63.0 in | Wt 111.0 lb

## 2023-04-18 DIAGNOSIS — M542 Cervicalgia: Secondary | ICD-10-CM | POA: Diagnosis not present

## 2023-04-18 NOTE — Progress Notes (Signed)
Orthopedic Spine Surgery Office Note  Assessment: Patient is a 82 y.o. female with neck pain with no radicular symptoms  Plan: -Explained that initially conservative treatment is tried as a significant number of patients may experience relief with these treatment modalities. Discussed that the conservative treatments include:  -activity modification  -physical therapy  -over the counter pain medications  -medrol dosepak  -cervical steroid injections -Patient has tried Activity modification, Tylenol, Motrin -Recommended Tylenol 1000 mg at night.  Also told her to apply lidocaine patches or Voltaren gel to the area -We talked about cervical ESI but patient was not interested at this time.  She will call if she wants that injection and I will put in the referral -Patient should return to office in 4 weeks, x-rays at next visit: None   Patient expressed understanding of the plan and all questions were answered to the patient's satisfaction.   ___________________________________________________________________________   History:  Patient is a 82 y.o. female who presents today for cervical spine.  Patient states that her symptoms started in March 2024.  She said she got a neck manipulation.  She said she has gotten these for years for low back issues.  She noted after this manipulation that she had onset of neck pain.  Pain has persisted since that time.  She feels the pain starts in her lower neck and radiates up to the posterior aspect of her head.  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  She has not noticed any other pain rating into the either upper extremity.  The pain in her shoulder is only present when moving the shoulder and does not have any pain in the shoulder at rest.  She also had symptoms of dizziness and felt like the room was spinning.  The symptoms have since resolved.  She is supposed to see a specialist for the  carotid artery.  Her pain is worst at night.  She feels pain on a daily basis.  Of note, patient rates the pain as an 8 out of 10 especially at night  Weakness: Denies Difficulty with fine motor skills (e.g., buttoning shirts, handwriting): Denies Symptoms of imbalance: Denies Paresthesias and numbness: Denies Bowel or bladder incontinence: Denies Saddle anesthesia: Denies  Treatments tried: Activity modification, Tylenol, Motrin  Review of systems: Denies fevers and chills, night sweats, unexplained weight loss, history of cancer, pain that wakes them at night  Past medical history: Osteoporosis (not treated as had issues with oral medications tried) Hypertension Irritable bowel syndrome Chronic pain  Allergies: NKDA  Past surgical history:  Thoracic lobectomy  Social history: Denies use of nicotine product (smoking, vaping, patches, smokeless) Alcohol use: Yes, 2-3 drinks per week Denies recreational drug use  Physical Exam:  General: no acute distress, appears stated age Neurologic: alert, answering questions appropriately, following commands Respiratory: unlabored breathing on room air, symmetric chest rise Psychiatric: appropriate affect, normal cadence to speech   MSK (spine):  -Strength exam      Left  Right Grip strength                5/5  5/5 Interosseus   5/5   5/5 Wrist extension  5/5  5/5 Wrist flexion   5/5  5/5 Elbow flexion   5/5  5/5 Deltoid    5/5  5/5  -Sensory exam    Sensation intact to light touch in C5-T1 nerve distributions of bilateral upper extremities  -  Brachioradialis DTR: 2/4 on the left, 2/4 on the right -Biceps DTR: 2/4 on the left, 2/4 on the right  -Spurling: Negative bilaterally -Hoffman sign: Negative bilaterally -Clonus: No beats bilaterally -Interosseous wasting: None seen -Grip and release test: Negative  Left shoulder exam: Pain with external rotation past 70 degrees, pain with internal rotation past 50 degrees,  no pain through remainder of range of motion, pain but no weakness with Jobe, negative belly press, no weakness with external rotation with arm at side Right shoulder exam: No pain to range of motion   Imaging: XR of the cervical spine from 04/18/2023 was independently reviewed and interpreted, showing disc height loss at C5/6 and C6/7. 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  Mild central stenosis at C5/6 with no cord deformation.  Left sided foraminal stenosis at C6/7 and C7/T1.  No T2 cord signal change seen.   Patient name: Erika Garner Patient MRN: [id number] Date of visit: 04/18/23

## 2023-05-08 ENCOUNTER — Ambulatory Visit
Admission: RE | Admit: 2023-05-08 | Discharge: 2023-05-08 | Disposition: A | Discharge status: Home / Self Care | Payer: Medicare PPO | Source: Ambulatory Visit | Attending: Neurosurgery

## 2023-05-08 ENCOUNTER — Ambulatory Visit
Admission: RE | Admit: 2023-05-08 | Discharge: 2023-05-08 | Disposition: A | Discharge status: Home / Self Care | Payer: Medicare PPO | Source: Ambulatory Visit | Attending: Neurosurgery | Admitting: Neurosurgery

## 2023-05-08 DIAGNOSIS — M542 Cervicalgia: Secondary | ICD-10-CM

## 2023-05-18 ENCOUNTER — Ambulatory Visit (INDEPENDENT_AMBULATORY_CARE_PROVIDER_SITE_OTHER): Payer: Medicare PPO | Admitting: Orthopedic Surgery

## 2023-05-18 DIAGNOSIS — M542 Cervicalgia: Secondary | ICD-10-CM | POA: Diagnosis not present

## 2023-05-18 MED ORDER — TRAZODONE HCL 50 MG PO TABS: 50.0000 mg | ORAL_TABLET | Freq: Every day | ORAL | 3 refills | Status: DC

## 2023-05-18 NOTE — Progress Notes (Signed)
Orthopedic Spine Surgery Office Note   Assessment: Patient is a 82 y.o. female with neck pain with no radicular symptoms   Plan: -Since her pain seems to be mostly with extension, it seems to be facetogenic in nature -Recommend increased pillows behind her head at night to help sleeping -Can continue with tylenol and motrin -Patient has tried Activity modification, Tylenol, Motrin -Prescribed trazadone to help her with sleep -We talked about injection again but she was not interested at this time. Said she would call if she decides she wants to try that treatment -Patient should return to office on an as needed basis     Patient expressed understanding of the plan and all questions were answered to the patient's satisfaction.    ___________________________________________________________________________     History:   Patient is a 82 y.o. female who presents today for follow up on her cervical spine.  She is still having pain in her neck.  She had tried Voltaren gel after our last visit but that did not help.  She said it felt like it almost made the pain worse.  Her pain is localized to the neck. 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  Pain does not radiate past the shoulder.  She has not developed any new pain since she was last seen.  Pain is worst at night especially when she tries to lay flat.  Denies paresthesias and numbness.   Treatments tried: Activity modification, Tylenol, Motrin    Physical Exam:   General: no acute distress, appears stated age Neurologic: alert, answering questions appropriately, following commands Respiratory: unlabored breathing on room air, symmetric chest rise Psychiatric: appropriate affect, normal cadence to speech     MSK (spine):   -Strength exam                                                   Left                  Right Grip strength                5/5                  5/5 Interosseus                  5/5                   5/5 Wrist extension            5/5                  5/5 Wrist flexion                 5/5                  5/5 Elbow flexion                5/5                  5/5 Deltoid                          5/5                  5/5   -Sensory exam  Sensation intact to light touch in C5-T1 nerve distributions of bilateral upper extremities  -Pain worse with extension at the neck    Imaging: XR of the cervical spine from 04/18/2023 was previously independently reviewed and interpreted, showing disc height loss at C5/6 and C6/7. 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  Mild central stenosis at C5/6 with no cord deformation.  Left sided foraminal stenosis at C6/7 and C7/T1.  No T2 cord signal change seen.     Patient name: Erika Garner Patient MRN: [id number] Date of visit: 05/18/23

## 2023-06-06 DIAGNOSIS — M5481 Occipital neuralgia: Secondary | ICD-10-CM | POA: Diagnosis not present

## 2023-06-09 ENCOUNTER — Other Ambulatory Visit: Payer: Self-pay | Admitting: Orthopedic Surgery

## 2023-06-24 ENCOUNTER — Ambulatory Visit (INDEPENDENT_AMBULATORY_CARE_PROVIDER_SITE_OTHER): Payer: Medicare PPO | Admitting: Orthopedic Surgery

## 2023-06-24 DIAGNOSIS — M25562 Pain in left knee: Secondary | ICD-10-CM

## 2023-06-25 ENCOUNTER — Encounter: Payer: Self-pay | Admitting: Orthopedic Surgery

## 2023-06-25 NOTE — Progress Notes (Signed)
   Office Visit Note   Patient: Erika Garner           Date of Birth: 1940-10-28           MRN: [id number] Visit Date: 06/24/2023 Requested by: Irven Coe, MD [address] [place],  [place] [address] PCP: Irven Coe, MD  Subjective: Chief Complaint  Patient presents with   Left Knee - Follow-up    HPI: Erika Garner is a 82 y.o. female who presents to the office reporting left knee pain.  Overall she states she is "doing much better".  She has been doing a lot of research to find ways to help her knee without surgery.  No longer using medication.  Has some occasional medial clicking with bending.  Would like to get PT referral..                ROS: All systems reviewed are negative as they relate to the chief complaint within the history of present illness.  Patient denies fevers or chills.  Assessment & Plan: Visit Diagnoses:  1. Left knee pain, unspecified chronicity     Plan: Impression is improvement in left knee mild medial compartment arthritis.  Trace effusion better.  She has good extension and flexion.  Quad strengthening could be helpful.  Therapy here to start and she wants to avoid injections and surgery.  Follow-up with [place] as needed.  Follow-Up Instructions: No follow-ups on file.   Orders:  Orders Placed This Encounter  Procedures   Ambulatory referral to Physical Therapy   No orders of the defined types were placed in this encounter.     Procedures: No procedures performed   Clinical Data: No additional findings.  Objective: Vital Signs: There were no vitals taken for this visit.  Physical Exam:  Constitutional: Patient appears well-developed HEENT:  Head: Normocephalic Eyes:EOM are normal Neck: Normal range of motion Cardiovascular: Normal rate Pulmonary/chest: Effort normal Neurologic: Patient is alert Skin: Skin is warm Psychiatric: Patient has normal mood and affect  Ortho Exam: Ortho exam demonstrates mild  effusion in that left knee.  Has nearly full extension to 120 flexion.  Collateral crucial ligaments are stable.  Quad strength improving.  No nerve root tension signs and no groin pain with internal or external rotation of that left leg.  Specialty Comments:  No specialty comments available.  Imaging: No results found.   PMFS History: Patient Active Problem List   Diagnosis Date Noted   Influenza A with respiratory manifestations 10/25/2016   Influenza 10/25/2016   Dehydration with hyponatremia 10/25/2016   Elevated blood pressure reading without diagnosis of hypertension 09/01/2012   Past Medical History:  Diagnosis Date   Hypertension     Family History  Problem Relation Age of Onset   Hypertension Mother    Stroke Mother    COPD Father     Past Surgical History:  Procedure Laterality Date   LOBECTOMY  1975   they thought she had TB, but did not   Social History   Occupational History   Not on file  Tobacco Use   Smoking status: Never   Smokeless tobacco: Never  Substance and Sexual Activity   Alcohol use: Yes    Alcohol/week: 7.0 standard drinks of alcohol    Types: 7 Glasses of wine per week   Drug use: No   Sexual activity: Not on file

## 2023-06-27 ENCOUNTER — Encounter: Payer: Self-pay | Admitting: Rehabilitative and Restorative Service Providers"

## 2023-06-27 ENCOUNTER — Other Ambulatory Visit: Payer: Self-pay

## 2023-06-27 ENCOUNTER — Ambulatory Visit (INDEPENDENT_AMBULATORY_CARE_PROVIDER_SITE_OTHER): Payer: Medicare PPO | Admitting: Rehabilitative and Restorative Service Providers"

## 2023-06-27 DIAGNOSIS — M25562 Pain in left knee: Secondary | ICD-10-CM

## 2023-06-27 DIAGNOSIS — G8929 Other chronic pain: Secondary | ICD-10-CM

## 2023-06-27 DIAGNOSIS — M6281 Muscle weakness (generalized): Secondary | ICD-10-CM

## 2023-06-27 DIAGNOSIS — R262 Difficulty in walking, not elsewhere classified: Secondary | ICD-10-CM

## 2023-06-27 NOTE — Therapy (Signed)
OUTPATIENT PHYSICAL THERAPY EVALUATION   Patient Name: Erika Garner MRN: [id number] DOB:10/22/1940, 82 y.o., female Today's Date: 06/27/2023  END OF SESSION:  PT End of Session - 06/27/23 0932     Visit Number 1    Number of Visits 12    Date for PT Re-Evaluation 08/08/23    Authorization Type [employer] / [employer]    Progress Note Due on Visit 10    PT Start Time 0938    PT Stop Time 1016    PT Time Calculation (min) 38 min    Activity Tolerance Patient tolerated treatment well    Behavior During Therapy [facility] for tasks assessed/performed             Past Medical History:  Diagnosis Date   Hypertension    Past Surgical History:  Procedure Laterality Date   LOBECTOMY  1975   they thought she had TB, but did not   Patient Active Problem List   Diagnosis Date Noted   Influenza A with respiratory manifestations 10/25/2016   Influenza 10/25/2016   Dehydration with hyponatremia 10/25/2016   Elevated blood pressure reading without diagnosis of hypertension 09/01/2012    PCP: Irven Coe MD  REFERRING PROVIDER: Cammy Copa, MD  REFERRING DIAG: [phone] (ICD-10-CM) - Left knee pain, unspecified chronicity  THERAPY DIAG:  Chronic pain of left knee  Muscle weakness (generalized)  Difficulty in walking, not elsewhere classified  Rationale for Evaluation and Treatment: Rehabilitation  ONSET DATE: May 2024  SUBJECTIVE:   SUBJECTIVE STATEMENT: Erika Garner indicated having complaints in May after doing some exercises in the morning and felt snap around knee with pain increase over the next few days after.  Pt indicated having to stop exercise and had difficulty in walking, stairs , WB activity.  Used hot pads and ice and after about 2 months symptoms started to reduce in severity.  Pt indicated since August to now feeling about the same without improvement.  Pt indicated feeling like they needed to feel stronger.   Pt indicated wanting to learn exercise for going  forward.   Pt also indicated having history of neck, vertigo and was planning on injection in neck in Nov.  Briefly discussed getting a referral in future.   PERTINENT HISTORY: HTN  PAIN:  NPRS scale: at worst 7/10 Pain location: Lt knee Pain description: WB pressure/pain Aggravating factors: WB activity, stairs Relieving factors: hot packs, ice  PRECAUTIONS: None  WEIGHT BEARING RESTRICTIONS: No  FALLS:  Has patient fallen in last 6 months? No  LIVING ENVIRONMENT: Lives in: House/apartment Stairs: stairs to office 2nd floor.   OCCUPATION: not working  PLOF: Independent, at home exercise routine in morning, walking for exercise, hiking, canoe.  [employer]  PATIENT GOALS: Reduce pain, get stronger.   OBJECTIVE:   PATIENT SURVEYS:  06/27/2023 FOTO intake:  38  predicted:  54  COGNITION: 06/27/2023 Overall cognitive status: WFL    SENSATION: 06/27/2023 No specific testing.   EDEMA:  06/27/2023 No specific testing  MUSCLE LENGTH: 06/27/2023 No specific testing.   POSTURE:  06/27/2023 FHP, Rounded   PALPATION: 06/27/2023 No specific testing  LOWER EXTREMITY ROM:   ROM Right 06/27/2023 Left 06/27/2023  Hip flexion    Hip extension    Hip abduction    Hip adduction    Hip internal rotation    Hip external rotation    Knee flexion 130 AROM in supine heel side 113 AROM in supine heel side  Knee extension 0 AROM in seated  LAQ -5 in seated LAQ  Ankle dorsiflexion    Ankle plantarflexion    Ankle inversion    Ankle eversion     (Blank rows = not tested)  LOWER EXTREMITY MMT:  MMT Right 06/27/2023 Left 06/27/2023  Hip flexion 5/5 5/5  Hip extension    Hip abduction    Hip adduction    Hip internal rotation    Hip external rotation    Knee flexion 5/5 5/5  Knee extension 4/5 26.6, 22.9 lbs 3+/5 10, 10.4 lbs  Ankle dorsiflexion 5/5 5/5  Ankle plantarflexion    Ankle inversion    Ankle eversion     (Blank rows = not tested)  LOWER  EXTREMITY SPECIAL TESTS:  06/27/2023 No specific testing  FUNCTIONAL TESTS:  06/27/2023 18 inch chair transfer: without UE with pain in Lt knee in eccentric lowing.  Lt SLS: 5 second with aberrant movement increased Rt SLS: 15 seconds  GAIT: 06/27/2023 Independent                                                                                                                                                                        TODAY'S TREATMENT                                                                          DATE: 06/27/2023 Therex:    HEP instruction/performance c cues for techniques, handout provided.  Trial set performed of each for comprehension and symptom assessment.  See below for exercise list Time spent in education about gym based exercise cues (will review in future) as well as aerobic exercise education and options including stepper, walking, bike etc).    PATIENT EDUCATION:  06/27/2023 Education details: HEP, POC Person educated: Patient Education method: Programmer, multimedia, Demonstration, Verbal cues, and Handouts Education comprehension: verbalized understanding, returned demonstration, and verbal cues required  HOME EXERCISE PROGRAM: Access Code: [email or web address] URL: [email or web address] Date: 06/27/2023 Prepared by: Chyrel Masson  Exercises - Seated Long Arc Quad (Mirrored)  - 3-5 x daily - 7 x weekly - 1-2 sets - 10 reps - 2 hold - Seated Quad Set (Mirrored)  - 3-5 x daily - 7 x weekly - 1 sets - 10 reps - 5 hold - Seated Straight Leg Heel Taps  - 1-2 x daily - 7 x weekly - 2-3 sets - 10 reps - Sit to Stand  - 3 x daily - 7 x weekly - 1 sets - 10 reps -  Tandem Stance in Corner  - 1 x daily - 7 x weekly - 1 sets - 3-5 reps - 30 hold  ASSESSMENT:  CLINICAL IMPRESSION: Patient is a 82 y.o. who comes to clinic with complaints of Lt knee pain with mobility, strength and movement coordination deficits that impair their ability to perform usual daily  and recreational functional activities without increase difficulty/symptoms at this time.  Patient to benefit from skilled PT services to address impairments and limitations to improve to previous level of function without restriction secondary to condition.   Pt may also benefit from referral related to neck and vertigo complaints.  Discussed with Pt getting a referral from Dr. Christell Constant to evaluate in future.   OBJECTIVE IMPAIRMENTS: Abnormal gait, decreased activity tolerance, decreased balance, decreased coordination, decreased endurance, decreased mobility, difficulty walking, decreased ROM, decreased strength, hypomobility, increased fascial restrictions, impaired perceived functional ability, impaired flexibility, improper body mechanics, and pain.   ACTIVITY LIMITATIONS: carrying, lifting, bending, sitting, standing, squatting, sleeping, stairs, transfers, and locomotion level  PARTICIPATION LIMITATIONS: meal prep, cleaning, laundry, interpersonal relationship, shopping, and community activity  PERSONAL FACTORS:  HTN, time since onset of symptoms, concurrent neck/vertigo symptoms  are also affecting patient's functional outcome.   REHAB POTENTIAL: Good  CLINICAL DECISION MAKING: Stable/uncomplicated  EVALUATION COMPLEXITY: Low   GOALS: Goals reviewed with patient? Yes  SHORT TERM GOALS: (target date for Short term goals are 3 weeks 07/18/2023)   1.  Patient will demonstrate independent use of home exercise program to maintain progress from in clinic treatments.  Goal status: New  LONG TERM GOALS: (target dates for all long term goals are 6weeks  08/08/2023 )   1. Patient will demonstrate/report pain at worst less than or equal to 2/10 to facilitate minimal limitation in daily activity secondary to pain symptoms.  Goal status: New   2. Patient will demonstrate independent use of home exercise program to facilitate ability to maintain/progress functional gains from skilled physical  therapy services.  Goal status: New   3. Patient will demonstrate FOTO outcome > or = 54 % to indicate reduced disability due to condition.  Goal status: New   4.  Patient will demonstrate bilateral knee  MMT 5/5, knee dynamometry bilateral > 25 lbs to faciltiate usual transfers, stairs, squatting at PLOF for daily life.   Goal status: New   5.  Patient will demonstrate sit to stand to sit 18 inch chair s UE assist s symptoms for daily movement.  Goal status: New   6.  Patient will demonstrate ability to ascend/descend flight of stairs c reciprocal gait pattern with hand rail for household navigation.  Goal status: New   7.  Patient will demonstrate SLS bilaterally > 10 seconds to facilitate stability in ambulation on even and uneven surfaces.  Goal Status: New   PLAN:  PT FREQUENCY: 1-2x/week  PT DURATION: 10 weeks  PLANNED INTERVENTIONS: Can include 40981- PT Re-evaluation, 97110-Therapeutic exercises, 97530- Therapeutic activity, [id number]- Neuromuscular re-education, 97535- Self Care, 97140- Manual therapy, [phone]- Gait training, [phone]- Orthotic Fit/training, [phone]- Canalith repositioning, [id number]- Aquatic Therapy, 97014- Electrical stimulation (unattended), [id number]- Electrical stimulation (manual), [id number]- Vasopneumatic device, [id number]- Ultrasound, [id number]- Traction (mechanical), [id number]- Ionotophoresis 4mg /ml Dexamethasone, Patient/Family education, Balance training, Stair training, Taping, Dry Needling, Joint mobilization, Joint manipulation, Spinal manipulation, Spinal mobilization, Scar mobilization, Vestibular training, Visual/preceptual remediation/compensation, DME instructions, Cryotherapy, and Moist heat.  All performed as medically necessary.  All included unless contraindicated  PLAN FOR NEXT SESSION: Review HEP knowledge/results.    Chyrel Masson,  PT, DPT, OCS, ATC 06/27/23  10:45 AM  [employer]  Referring diagnosis? M25.562 (ICD-10-CM) - Left knee pain, unspecified  chronicity Treatment diagnosis? (if different than referring diagnosis)   M25.562 What was this (referring dx) caused by? []  Surgery []  Fall [x]  Ongoing issue []  Arthritis []  Other: ____________  Laterality: []  Rt [x]  Lt []  Both  Check all possible CPT codes:  *CHOOSE 10 OR LESS*    []  97110 (Therapeutic Exercise)  []  92507 (SLP Treatment)  []  97112 (Neuro Re-ed)   []  92526 (Swallowing Treatment)   []  97116 (Gait Training)   []  [id number] (Cognitive Training, 1st 15 minutes) []  97140 (Manual Therapy)   []  97130 (Cognitive Training, each add'l 15 minutes)  []  97164 (Re-evaluation)                              []  Other, List CPT Code ____________  []  97530 (Therapeutic Activities)     []  97535 (Self Care)   [x]  All codes above (97110 - 97535)  []  97012 (Mechanical Traction)  [x]  97014 (E-stim Unattended)  []  97032 (E-stim manual)  []  97033 (Ionto)  []  97035 (Ultrasound) [x]  97750 (Physical Performance Training) []  16109 (Aquatic Therapy) []  97016 (Vasopneumatic Device) []  [id number] (Paraffin) []  97034 (Contrast Bath) []  97597 (Wound Care 1st 20 sq cm) []  97598 (Wound Care each add'l 20 sq cm) []  97760 (Orthotic Fabrication, Fitting, Training Initial) []  [id number] (Prosthetic Management and Training Initial) []  [id number] (Orthotic or Prosthetic Training/ Modification Subsequent)

## 2023-06-28 ENCOUNTER — Telehealth: Payer: Self-pay | Admitting: Orthopedic Surgery

## 2023-06-28 DIAGNOSIS — M542 Cervicalgia: Secondary | ICD-10-CM

## 2023-06-28 NOTE — Addendum Note (Signed)
Addended by: Willia Craze on: 06/28/2023 03:27 PM   Modules accepted: Orders

## 2023-06-28 NOTE — Telephone Encounter (Signed)
Pt called in stating she would like a referral to see if she can maybe do some PT for her neck and see if that helps before she gets a injection from [facility]

## 2023-07-05 ENCOUNTER — Encounter: Payer: Self-pay | Admitting: Physical Therapy

## 2023-07-05 ENCOUNTER — Ambulatory Visit (INDEPENDENT_AMBULATORY_CARE_PROVIDER_SITE_OTHER): Payer: Medicare PPO | Admitting: Physical Therapy

## 2023-07-05 DIAGNOSIS — M25562 Pain in left knee: Secondary | ICD-10-CM | POA: Diagnosis not present

## 2023-07-05 DIAGNOSIS — M542 Cervicalgia: Secondary | ICD-10-CM | POA: Diagnosis not present

## 2023-07-05 DIAGNOSIS — G8929 Other chronic pain: Secondary | ICD-10-CM

## 2023-07-05 DIAGNOSIS — R293 Abnormal posture: Secondary | ICD-10-CM | POA: Diagnosis not present

## 2023-07-05 DIAGNOSIS — R262 Difficulty in walking, not elsewhere classified: Secondary | ICD-10-CM | POA: Diagnosis not present

## 2023-07-05 DIAGNOSIS — M6281 Muscle weakness (generalized): Secondary | ICD-10-CM | POA: Diagnosis not present

## 2023-07-05 NOTE — Therapy (Signed)
OUTPATIENT PHYSICAL THERAPY EVALUATION   Patient Name: Erika Garner MRN: [id number] DOB:1941-01-27, 82 y.o., female Today's Date: 07/05/2023  END OF SESSION:  PT End of Session - 07/05/23 0926     Visit Number 2    Number of Visits 12    Date for PT Re-Evaluation 08/08/23    Authorization Type [employer] / [employer]    Progress Note Due on Visit 10    PT Start Time 0930    PT Stop Time 1010    PT Time Calculation (min) 40 min    Activity Tolerance Patient tolerated treatment well    Behavior During Therapy WFL for tasks assessed/performed              Past Medical History:  Diagnosis Date   Hypertension    Past Surgical History:  Procedure Laterality Date   LOBECTOMY  1975   they thought she had TB, but did not   Patient Active Problem List   Diagnosis Date Noted   Influenza A with respiratory manifestations 10/25/2016   Influenza 10/25/2016   Dehydration with hyponatremia 10/25/2016   Elevated blood pressure reading without diagnosis of hypertension 09/01/2012    PCP: Irven Coe MD  REFERRING PROVIDER: Cammy Copa, MD Willia Craze, MD  REFERRING DIAG: [phone] (ICD-10-CM) - Left knee pain, unspecified chronicity M54.2 (ICD-10-CM) - Neck pain   THERAPY DIAG:  Chronic pain of left knee - Plan: PT plan of care cert/re-cert  Muscle weakness (generalized) - Plan: PT plan of care cert/re-cert  Difficulty in walking, not elsewhere classified - Plan: PT plan of care cert/re-cert  Cervicalgia - Plan: PT plan of care cert/re-cert  Abnormal posture - Plan: PT plan of care cert/re-cert  Rationale for Evaluation and Treatment: Rehabilitation  ONSET DATE: May 2024  SUBJECTIVE:   SUBJECTIVE STATEMENT: Pt arrives today with new referral for neck pain.  She reports in March 2024 she went to a chiropractor and he did a neck manipulation, which pain then got progressively worse.  About 1-2 months following; had episode of visual distortion which lasted about  2 weeks.  She still expresses some mild visual deficits ("fuzzy" with distance).   Pt indicated wanting to learn exercise for going forward.   Pt also indicated having history of neck, vertigo and was planning on injection in neck in Nov.  Briefly discussed getting a referral in future.   PERTINENT HISTORY: HTN  PAIN:  NPRS scale: 6 currently, up to 8, at best 5/10 Pain location: neck, Lt>Rt Pain description: aching, spasm with yawning Aggravating factors: yawning, can't put head down (gets pain that may indicate vertigo) Relieving factors: heat, ice  PRECAUTIONS: None  WEIGHT BEARING RESTRICTIONS: No  FALLS:  Has patient fallen in last 6 months? No  LIVING ENVIRONMENT: Lives in: House/apartment Stairs: stairs to office 2nd floor.   OCCUPATION: not working  PLOF: Independent, at home exercise routine in morning, walking for exercise, hiking, canoe.  [employer]  PATIENT GOALS: Reduce pain, get stronger.   OBJECTIVE:   PATIENT SURVEYS:  06/27/2023 FOTO intake:  38  predicted:  54  COGNITION: 06/27/2023 Overall cognitive status: WFL    SENSATION: 06/27/2023 No specific testing.   EDEMA:  06/27/2023 No specific testing  MUSCLE LENGTH: 06/27/2023 No specific testing.   POSTURE:  06/27/2023 FHP, Rounded   PALPATION: 06/27/2023 No specific testing  07/05/23: Tenderness with active trigger point in bil UT, SCM and levators  LOWER EXTREMITY ROM:   ROM Right 06/27/2023 Left 06/27/2023  Hip  flexion    Hip extension    Hip abduction    Hip adduction    Hip internal rotation    Hip external rotation    Knee flexion 130 AROM in supine heel side 113 AROM in supine heel side  Knee extension 0 AROM in seated LAQ -5 in seated LAQ  Ankle dorsiflexion    Ankle plantarflexion    Ankle inversion    Ankle eversion     (Blank rows = not tested)  LOWER EXTREMITY MMT:  MMT Right 06/27/2023 Left 06/27/2023  Hip flexion 5/5 5/5  Hip extension    Hip abduction     Hip adduction    Hip internal rotation    Hip external rotation    Knee flexion 5/5 5/5  Knee extension 4/5 26.6, 22.9 lbs 3+/5 10, 10.4 lbs  Ankle dorsiflexion 5/5 5/5  Ankle plantarflexion    Ankle inversion    Ankle eversion     (Blank rows = not tested)   CERVICAL ROM:   Active ROM A/PROM (deg) eval  Flexion 27  Extension 8  Right lateral flexion 10 (with pain)  Left lateral flexion 12 (with pain)  Right rotation 31  Left rotation 41   (Blank rows = not tested)   UE Measurements  Upper Extremity Right 07/05/2023 Left 07/05/2023   AROM MMT AROM MMT  Shoulder Flexion 125  84   Shoulder Extension      Shoulder Abduction 131  80   Grip Strength NA  NA     (Blank rows = not tested)   * pain   LOWER EXTREMITY SPECIAL TESTS:  06/27/2023 No specific testing  FUNCTIONAL TESTS:  06/27/2023 18 inch chair transfer: without UE with pain in Lt knee in eccentric lowing.  Lt SLS: 5 second with aberrant movement increased Rt SLS: 15 seconds  GAIT: 06/27/2023 Independent                                                                                                                                                                        TODAY'S TREATMENT DATE:  07/05/23 TherEx Objective measures for neck/shoulders - see above for details See HEP - demonstrated exercises with trial reps performed; mod cues needed for technique  Self Care Discussed/educated on dry needling; pt defers treatment at this time    06/27/2023 Therex:    HEP instruction/performance c cues for techniques, handout provided.  Trial set performed of each for comprehension and symptom assessment.  See below for exercise list Time spent in education about gym based exercise cues (will review in future) as well as aerobic exercise education and options including stepper, walking, bike etc).    PATIENT EDUCATION:  06/27/2023 Education details: HEP, POC Person  educated: Patient Education  method: Explanation, Demonstration, Verbal cues, and Handouts Education comprehension: verbalized understanding, returned demonstration, and verbal cues required  HOME EXERCISE PROGRAM: Access Code: [email or web address] URL: [email or web address] Date: 06/27/2023 Prepared by: Chyrel Masson  Exercises - Seated Long Arc Quad (Mirrored)  - 3-5 x daily - 7 x weekly - 1-2 sets - 10 reps - 2 hold - Seated Quad Set (Mirrored)  - 3-5 x daily - 7 x weekly - 1 sets - 10 reps - 5 hold - Seated Straight Leg Heel Taps  - 1-2 x daily - 7 x weekly - 2-3 sets - 10 reps - Sit to Stand  - 3 x daily - 7 x weekly - 1 sets - 10 reps - Tandem Stance in Corner  - 1 x daily - 7 x weekly - 1 sets - 3-5 reps - 30 hold  Cervical:  Access Code: [email or web address] URL: [email or web address] Date: 07/05/2023 Prepared by: Moshe Cipro  Exercises - Half Neck Circles  - 1 x daily - 7 x weekly - 3 sets - 10 reps - Seated Scapular Retraction  - 2 x daily - 7 x weekly - 1 sets - 10 reps - 5 sec hold - Seated Levator Scapulae Stretch  - 2 x daily - 7 x weekly - 1 sets - 2-3 reps - 30 sec hold - Seated Upper Trapezius Stretch  - 2 x daily - 7 x weekly - 1 sets - 2-3 reps - 30 sec hold - Full Leg Press  - 1 x daily - 7 x weekly - 3 sets - 10 reps - Seated Row Cable Machine  - 1 x daily - 7 x weekly - 3 sets - 10 reps  Patient Education - Trigger Point Dry Needling  ASSESSMENT:  CLINICAL IMPRESSION: Pt seen today for assessment of neck pain as she has a new referral for this. She demonstrates decreased ROM and strength as well as active trigger points and postural abnormalities affecting functional moiblity.  She will benefit from continued PT to address this as well as Lt knee pain.  Will continue to benefit from PT to maximize function.     Pt may also benefit from referral related to neck and vertigo complaints.  Discussed with Pt getting a referral from Dr. Christell Constant to evaluate in future.   OBJECTIVE  IMPAIRMENTS: Abnormal gait, decreased activity tolerance, decreased balance, decreased coordination, decreased endurance, decreased mobility, difficulty walking, decreased ROM, decreased strength, hypomobility, increased fascial restrictions, impaired perceived functional ability, impaired flexibility, improper body mechanics, and pain.   ACTIVITY LIMITATIONS: carrying, lifting, bending, sitting, standing, squatting, sleeping, stairs, transfers, and locomotion level  PARTICIPATION LIMITATIONS: meal prep, cleaning, laundry, interpersonal relationship, shopping, and community activity  PERSONAL FACTORS:  HTN, time since onset of symptoms, concurrent neck/vertigo symptoms  are also affecting patient's functional outcome.   REHAB POTENTIAL: Good  CLINICAL DECISION MAKING: Stable/uncomplicated  EVALUATION COMPLEXITY: Low   GOALS: Goals reviewed with patient? Yes  SHORT TERM GOALS: (target date for Short term goals are 3 weeks 07/18/2023)   1.  Patient will demonstrate independent use of home exercise program to maintain progress from in clinic treatments.  Goal status: New  LONG TERM GOALS: (target dates for all long term goals are 6weeks  08/08/2023 )   1. Patient will demonstrate/report pain at worst less than or equal to 2/10 to facilitate minimal limitation in daily activity secondary to pain symptoms.  Goal status: New   2. Patient will demonstrate independent use of home  exercise program to facilitate ability to maintain/progress functional gains from skilled physical therapy services.  Goal status: New   3. Patient will demonstrate FOTO outcome > or = 54 % to indicate reduced disability due to condition.  Goal status: New   4.  Patient will demonstrate bilateral knee  MMT 5/5, knee dynamometry bilateral > 25 lbs to faciltiate usual transfers, stairs, squatting at PLOF for daily life.   Goal status: New   5.  Patient will demonstrate sit to stand to sit 18 inch chair s UE  assist s symptoms for daily movement.  Goal status: New   6.  Patient will demonstrate ability to ascend/descend flight of stairs c reciprocal gait pattern with hand rail for household navigation.  Goal status: New   7.  Patient will demonstrate SLS bilaterally > 10 seconds to facilitate stability in ambulation on even and uneven surfaces.  Goal Status: New   PLAN:  PT FREQUENCY: 1-2x/week  PT DURATION: 10 weeks  PLANNED INTERVENTIONS: Can include 21308- PT Re-evaluation, 97110-Therapeutic exercises, 97530- Therapeutic activity, [id number]- Neuromuscular re-education, 97535- Self Care, 97140- Manual therapy, [phone]- Gait training, [phone]- Orthotic Fit/training, [phone]- Canalith repositioning, [id number]- Aquatic Therapy, 97014- Electrical stimulation (unattended), [id number]- Electrical stimulation (manual), [id number]- Vasopneumatic device, Q330749- Ultrasound, [id number]- Traction (mechanical), [id number]- Ionotophoresis 4mg /ml Dexamethasone, Patient/Family education, Balance training, Stair training, Taping, Dry Needling, Joint mobilization, Joint manipulation, Spinal manipulation, Spinal mobilization, Scar mobilization, Vestibular training, Visual/preceptual remediation/compensation, DME instructions, Cryotherapy, and Moist heat.  All performed as medically necessary.  All included unless contraindicated  PLAN FOR NEXT SESSION: review HEP, does pt want to try DN?, make sure she has good program for going to [facility] for 6 weeks    Clarita Crane, PT, DPT 07/05/23 11:56 AM   [employer]  Referring diagnosis? M25.562 (ICD-10-CM) - Left knee pain, unspecified chronicity Treatment diagnosis? (if different than referring diagnosis)   M25.562, G89.29, M62.81, R26.2, M54.2, R29.3 What was this (referring dx) caused by? []  Surgery []  Fall [x]  Ongoing issue []  Arthritis []  Other: ____________  Laterality: []  Rt [x]  Lt []  Both  Check all possible CPT codes:  *CHOOSE 10 OR LESS*    []  97110 (Therapeutic Exercise)  []   92507 (SLP Treatment)  []  97112 (Neuro Re-ed)   []  92526 (Swallowing Treatment)   []  97116 (Gait Training)   []  [id number] (Cognitive Training, 1st 15 minutes) []  97140 (Manual Therapy)   []  97130 (Cognitive Training, each add'l 15 minutes)  []  97164 (Re-evaluation)                              []  Other, List CPT Code ____________  []  97530 (Therapeutic Activities)     []  97535 (Self Care)   [x]  All codes above (97110 - 97535)  []  97012 (Mechanical Traction)  [x]  97014 (E-stim Unattended)  []  97032 (E-stim manual)  []  97033 (Ionto)  []  97035 (Ultrasound) [x]  97750 (Physical Performance Training) []  [id number] (Aquatic Therapy) []  97016 (Vasopneumatic Device) []  [id number] (Paraffin) []  97034 (Contrast Bath) []  97597 (Wound Care 1st 20 sq cm) []  97598 (Wound Care each add'l 20 sq cm) []  97760 (Orthotic Fabrication, Fitting, Training Initial) []  [id number] (Prosthetic Management and Training Initial) []  [id number] (Orthotic or Prosthetic Training/ Modification Subsequent)

## 2023-07-08 DIAGNOSIS — H26493 Other secondary cataract, bilateral: Secondary | ICD-10-CM | POA: Diagnosis not present

## 2023-07-12 ENCOUNTER — Ambulatory Visit (INDEPENDENT_AMBULATORY_CARE_PROVIDER_SITE_OTHER): Payer: Medicare PPO | Admitting: Rehabilitative and Restorative Service Providers"

## 2023-07-12 ENCOUNTER — Encounter: Payer: Self-pay | Admitting: Rehabilitative and Restorative Service Providers"

## 2023-07-12 DIAGNOSIS — R293 Abnormal posture: Secondary | ICD-10-CM | POA: Diagnosis not present

## 2023-07-12 DIAGNOSIS — M542 Cervicalgia: Secondary | ICD-10-CM | POA: Diagnosis not present

## 2023-07-12 DIAGNOSIS — M6281 Muscle weakness (generalized): Secondary | ICD-10-CM

## 2023-07-12 DIAGNOSIS — R262 Difficulty in walking, not elsewhere classified: Secondary | ICD-10-CM | POA: Diagnosis not present

## 2023-07-12 DIAGNOSIS — M25562 Pain in left knee: Secondary | ICD-10-CM | POA: Diagnosis not present

## 2023-07-12 DIAGNOSIS — G8929 Other chronic pain: Secondary | ICD-10-CM

## 2023-07-12 NOTE — Therapy (Signed)
OUTPATIENT PHYSICAL THERAPY EVALUATION   Patient Name: Erika Garner MRN: [id number] DOB:09-15-1940, 82 y.o., female Today's Date: 07/12/2023  END OF SESSION:  PT End of Session - 07/12/23 1109     Visit Number 3    Number of Visits 12    Date for PT Re-Evaluation 08/08/23    Authorization Type [employer] / [employer]    Progress Note Due on Visit 10    PT Start Time 1048    PT Stop Time 1128    PT Time Calculation (min) 40 min    Activity Tolerance Patient tolerated treatment well    Behavior During Therapy WFL for tasks assessed/performed               Past Medical History:  Diagnosis Date   Hypertension    Past Surgical History:  Procedure Laterality Date   LOBECTOMY  1975   they thought she had TB, but did not   Patient Active Problem List   Diagnosis Date Noted   Influenza A with respiratory manifestations 10/25/2016   Influenza 10/25/2016   Dehydration with hyponatremia 10/25/2016   Elevated blood pressure reading without diagnosis of hypertension 09/01/2012    PCP: Irven Coe MD  REFERRING PROVIDER: Cammy Copa, MD Willia Craze, MD  REFERRING DIAG: [phone] (ICD-10-CM) - Left knee pain, unspecified chronicity M54.2 (ICD-10-CM) - Neck pain   THERAPY DIAG:  Chronic pain of left knee  Muscle weakness (generalized)  Difficulty in walking, not elsewhere classified  Cervicalgia  Abnormal posture  Rationale for Evaluation and Treatment: Rehabilitation  ONSET DATE: May 2024  SUBJECTIVE:   SUBJECTIVE STATEMENT: Pt indicated working on exercises with no adverse reactions to them.  Pt indicatd neck pain around 4/10.  Pt indicated having "really bad" pain 2 days ago in leg but it was stiffness primarily.    PERTINENT HISTORY: HTN  PAIN:  NPRS scale: 4/10 Pain location: neck, Lt>Rt, Lt knee  Pain description: aching, spasm with yawning Aggravating factors: yawning, can't put head down (gets pain that may indicate vertigo) Relieving  factors: heat, ice  PRECAUTIONS: None  WEIGHT BEARING RESTRICTIONS: No  FALLS:  Has patient fallen in last 6 months? No  LIVING ENVIRONMENT: Lives in: House/apartment Stairs: stairs to office 2nd floor.   OCCUPATION: not working  PLOF: Independent, at home exercise routine in morning, walking for exercise, hiking, canoe.  [employer]  PATIENT GOALS: Reduce pain, get stronger.   OBJECTIVE:   PATIENT SURVEYS:  FOTO:  update FOTO 54  06/27/2023 FOTO intake:  38  predicted:  54  COGNITION: 06/27/2023 Overall cognitive status: WFL    SENSATION: 06/27/2023 No specific testing.   EDEMA:  06/27/2023 No specific testing  MUSCLE LENGTH: 06/27/2023 No specific testing.   POSTURE:  06/27/2023 FHP, Rounded   PALPATION: 06/27/2023 No specific testing  07/05/23: Tenderness with active trigger point in bil UT, SCM and levators  LOWER EXTREMITY ROM:   ROM Right 06/27/2023 Left 06/27/2023  Hip flexion    Hip extension    Hip abduction    Hip adduction    Hip internal rotation    Hip external rotation    Knee flexion 130 AROM in supine heel side 113 AROM in supine heel side  Knee extension 0 AROM in seated LAQ -5 in seated LAQ  Ankle dorsiflexion    Ankle plantarflexion    Ankle inversion    Ankle eversion     (Blank rows = not tested)  LOWER EXTREMITY MMT:  MMT Right 06/27/2023  Left 06/27/2023  Hip flexion 5/5 5/5  Hip extension    Hip abduction    Hip adduction    Hip internal rotation    Hip external rotation    Knee flexion 5/5 5/5  Knee extension 4/5 26.6, 22.9 lbs 3+/5 10, 10.4 lbs  Ankle dorsiflexion 5/5 5/5  Ankle plantarflexion    Ankle inversion    Ankle eversion     (Blank rows = not tested)   CERVICAL ROM:   Active ROM AROM (deg) eval  Flexion 27  Extension 8  Right lateral flexion 10 (with pain)  Left lateral flexion 12 (with pain)  Right rotation 31  Left rotation 41   (Blank rows = not tested)   UE Measurements  Upper  Extremity Right 07/05/2023 Left 07/05/2023   AROM MMT AROM MMT  Shoulder Flexion 125  84   Shoulder Extension      Shoulder Abduction 131  80   Grip Strength NA  NA     (Blank rows = not tested)   * pain   LOWER EXTREMITY SPECIAL TESTS:  06/27/2023 No specific testing  FUNCTIONAL TESTS:  06/27/2023 18 inch chair transfer: without UE with pain in Lt knee in eccentric lowing.  Lt SLS: 5 second with aberrant movement increased Rt SLS: 15 seconds  GAIT: 06/27/2023 Independent                                                                                                                                                                        TODAY'S TREATMENT                                                          DATE: 07/12/2023 Therex:   Nustep Lvl 5 UE/LE 6 mins Standing green band rows c scapular retraction 2 x 15 bilateral Standing green band GH ext 2 x 15 bilateral Standing bilateral shoulder ER c scapular retraction green band 2 x 10 Seated quad set with SLR 2 x 10 bilateral Sit to stand to sit with slow lowering focus x 10   Verbal review of several HEP to improve techniques and use at home.    TODAY'S TREATMENT                                                          DATE: 07/05/23 TherEx Objective measures for neck/shoulders -  see above for details See HEP - demonstrated exercises with trial reps performed; mod cues needed for technique  Self Care Discussed/educated on dry needling; pt defers treatment at this time    TODAY'S TREATMENT                                                          DATE:06/27/2023 Therex:    HEP instruction/performance c cues for techniques, handout provided.  Trial set performed of each for comprehension and symptom assessment.  See below for exercise list Time spent in education about gym based exercise cues (will review in future) as well as aerobic exercise education and options including stepper, walking, bike etc).    PATIENT  EDUCATION:  06/27/2023 Education details: HEP, POC Person educated: Patient Education method: Programmer, multimedia, Demonstration, Verbal cues, and Handouts Education comprehension: verbalized understanding, returned demonstration, and verbal cues required  HOME EXERCISE PROGRAM: Access Code: [email or web address] URL: [email or web address] Date: 06/27/2023 Prepared by: Chyrel Masson  Exercises - Seated Long Arc Quad (Mirrored)  - 3-5 x daily - 7 x weekly - 1-2 sets - 10 reps - 2 hold - Seated Quad Set (Mirrored)  - 3-5 x daily - 7 x weekly - 1 sets - 10 reps - 5 hold - Seated Straight Leg Heel Taps  - 1-2 x daily - 7 x weekly - 2-3 sets - 10 reps - Sit to Stand  - 3 x daily - 7 x weekly - 1 sets - 10 reps - Tandem Stance in Corner  - 1 x daily - 7 x weekly - 1 sets - 3-5 reps - 30 hold  Cervical:  Access Code: [email or web address] URL: [email or web address] Date: 07/12/2023 Prepared by: Chyrel Masson  Exercises - Half Neck Circles  - 1 x daily - 7 x weekly - 3 sets - 10 reps - Seated Scapular Retraction  - 2 x daily - 7 x weekly - 1 sets - 10 reps - 5 sec hold - Seated Levator Scapulae Stretch  - 2 x daily - 7 x weekly - 1 sets - 2-3 reps - 30 sec hold - Seated Upper Trapezius Stretch  - 2 x daily - 7 x weekly - 1 sets - 2-3 reps - 30 sec hold - Full Leg Press  - 1 x daily - 7 x weekly - 3 sets - 10 reps - Seated Row Cable Machine  - 1 x daily - 7 x weekly - 3 sets - 10 reps - Shoulder External Rotation and Scapular Retraction with Resistance  - 1 x daily - 7 x weekly - 1-2 sets - 10-15 reps - Standing Shoulder Row with Anchored Resistance  - 1 x daily - 7 x weekly - 1-2 sets - 10-15 reps - Shoulder Extension with Resistance  - 1 x daily - 7 x weekly - 1-2 sets - 10-15 reps  ASSESSMENT:  CLINICAL IMPRESSION: Spent time in review of existing HEP and improvements in techniques as well as review of new band related scapular strengthening.  Pt will be going to [place] and will plan  to continue with HEP.  Reassess as necessary in future.   OBJECTIVE IMPAIRMENTS: Abnormal gait, decreased activity tolerance, decreased balance, decreased coordination, decreased endurance, decreased mobility, difficulty walking, decreased ROM, decreased strength, hypomobility, increased fascial restrictions, impaired perceived functional ability, impaired flexibility,  improper body mechanics, and pain.   ACTIVITY LIMITATIONS: carrying, lifting, bending, sitting, standing, squatting, sleeping, stairs, transfers, and locomotion level  PARTICIPATION LIMITATIONS: meal prep, cleaning, laundry, interpersonal relationship, shopping, and community activity  PERSONAL FACTORS:  HTN, time since onset of symptoms, concurrent neck/vertigo symptoms  are also affecting patient's functional outcome.   REHAB POTENTIAL: Good  CLINICAL DECISION MAKING: Stable/uncomplicated  EVALUATION COMPLEXITY: Low   GOALS: Goals reviewed with patient? Yes  SHORT TERM GOALS: (target date for Short term goals are 3 weeks 07/18/2023)   1.  Patient will demonstrate independent use of home exercise program to maintain progress from in clinic treatments.  Goal status: on going   LONG TERM GOALS: (target dates for all long term goals are 6weeks  08/08/2023 )   1. Patient will demonstrate/report pain at worst less than or equal to 2/10 to facilitate minimal limitation in daily activity secondary to pain symptoms.  Goal status: New   2. Patient will demonstrate independent use of home exercise program to facilitate ability to maintain/progress functional gains from skilled physical therapy services.  Goal status: New   3. Patient will demonstrate FOTO outcome > or = 54 % to indicate reduced disability due to condition.  Goal status: New   4.  Patient will demonstrate bilateral knee  MMT 5/5, knee dynamometry bilateral > 25 lbs to faciltiate usual transfers, stairs, squatting at PLOF for daily life.   Goal status:  New   5.  Patient will demonstrate sit to stand to sit 18 inch chair s UE assist s symptoms for daily movement.  Goal status: New   6.  Patient will demonstrate ability to ascend/descend flight of stairs c reciprocal gait pattern with hand rail for household navigation.  Goal status: New   7.  Patient will demonstrate SLS bilaterally > 10 seconds to facilitate stability in ambulation on even and uneven surfaces.  Goal Status: New   PLAN:  PT FREQUENCY: 1-2x/week  PT DURATION: 10 weeks  PLANNED INTERVENTIONS: Can include 78469- PT Re-evaluation, 97110-Therapeutic exercises, 97530- Therapeutic activity, [id number]- Neuromuscular re-education, 97535- Self Care, 97140- Manual therapy, [phone]- Gait training, [phone]- Orthotic Fit/training, [phone]- Canalith repositioning, [id number]- Aquatic Therapy, 97014- Electrical stimulation (unattended), [id number]- Electrical stimulation (manual), [id number]- Vasopneumatic device, [id number]- Ultrasound, [id number]- Traction (mechanical), [id number]- Ionotophoresis 4mg /ml Dexamethasone, Patient/Family education, Balance training, Stair training, Taping, Dry Needling, Joint mobilization, Joint manipulation, Spinal manipulation, Spinal mobilization, Scar mobilization, Vestibular training, Visual/preceptual remediation/compensation, DME instructions, Cryotherapy, and Moist heat.  All performed as medically necessary.  All included unless contraindicated  PLAN FOR NEXT SESSION:  recert upon return.    Chyrel Masson, PT, DPT, OCS, ATC 07/12/23  11:38 AM     [employer]  Referring diagnosis? M25.562 (ICD-10-CM) - Left knee pain, unspecified chronicity Treatment diagnosis? (if different than referring diagnosis)   M25.562, G89.29, M62.81, R26.2, M54.2, R29.3 What was this (referring dx) caused by? []  Surgery []  Fall [x]  Ongoing issue []  Arthritis []  Other: ____________  Laterality: []  Rt [x]  Lt []  Both  Check all possible CPT codes:  *CHOOSE 10 OR LESS*    []  97110 (Therapeutic  Exercise)  []  92507 (SLP Treatment)  []  97112 (Neuro Re-ed)   []  92526 (Swallowing Treatment)   []  97116 (Gait Training)   []  [id number] (Cognitive Training, 1st 15 minutes) []  97140 (Manual Therapy)   []  27253 (Cognitive Training, each add'l 15 minutes)  []  97164 (Re-evaluation)                              []   Other, List CPT Code ____________  []  97530 (Therapeutic Activities)     []  97535 (Self Care)   [x]  All codes above (97110 - 97535)  []  97012 (Mechanical Traction)  [x]  97014 (E-stim Unattended)  []  97032 (E-stim manual)  []  97033 (Ionto)  []  97035 (Ultrasound) [x]  97750 (Physical Performance Training) []  [id number] (Aquatic Therapy) []  97016 (Vasopneumatic Device) []  [id number] (Paraffin) []  97034 (Contrast Bath) []  97597 (Wound Care 1st 20 sq cm) []  97598 (Wound Care each add'l 20 sq cm) []  97760 (Orthotic Fabrication, Fitting, Training Initial) []  [id number] (Prosthetic Management and Training Initial) []  [id number] (Orthotic or Prosthetic Training/ Modification Subsequent)

## 2023-08-09 DIAGNOSIS — M4802 Spinal stenosis, cervical region: Secondary | ICD-10-CM | POA: Diagnosis not present

## 2023-08-09 DIAGNOSIS — I10 Essential (primary) hypertension: Secondary | ICD-10-CM | POA: Diagnosis not present

## 2023-08-09 DIAGNOSIS — J439 Emphysema, unspecified: Secondary | ICD-10-CM | POA: Diagnosis not present

## 2023-08-11 DIAGNOSIS — I10 Essential (primary) hypertension: Secondary | ICD-10-CM | POA: Diagnosis not present

## 2023-10-25 DIAGNOSIS — L821 Other seborrheic keratosis: Secondary | ICD-10-CM | POA: Diagnosis not present

## 2023-10-25 DIAGNOSIS — L84 Corns and callosities: Secondary | ICD-10-CM | POA: Diagnosis not present

## 2023-10-25 DIAGNOSIS — D1801 Hemangioma of skin and subcutaneous tissue: Secondary | ICD-10-CM | POA: Diagnosis not present
